# Patient Record
Sex: Female | Born: 1975 | Race: White | Hispanic: No | Marital: Married | Smoking: Never smoker
Health system: Southern US, Community
[De-identification: ages and names within clinical notes are randomized; demographics above are authoritative.]

## PROBLEM LIST (undated history)

## (undated) DIAGNOSIS — M549 Dorsalgia, unspecified: Secondary | ICD-10-CM

## (undated) DIAGNOSIS — M2550 Pain in unspecified joint: Secondary | ICD-10-CM

## (undated) DIAGNOSIS — R0602 Shortness of breath: Secondary | ICD-10-CM

## (undated) DIAGNOSIS — E039 Hypothyroidism, unspecified: Secondary | ICD-10-CM

## (undated) DIAGNOSIS — F32A Depression, unspecified: Secondary | ICD-10-CM

## (undated) DIAGNOSIS — E669 Obesity, unspecified: Secondary | ICD-10-CM

## (undated) DIAGNOSIS — R5383 Other fatigue: Secondary | ICD-10-CM

## (undated) DIAGNOSIS — E559 Vitamin D deficiency, unspecified: Secondary | ICD-10-CM

## (undated) DIAGNOSIS — F419 Anxiety disorder, unspecified: Secondary | ICD-10-CM

## (undated) HISTORY — DX: Dorsalgia, unspecified: M54.9

## (undated) HISTORY — DX: Pain in unspecified joint: M25.50

## (undated) HISTORY — DX: Shortness of breath: R06.02

## (undated) HISTORY — DX: Depression, unspecified: F32.A

## (undated) HISTORY — DX: Other fatigue: R53.83

## (undated) HISTORY — DX: Obesity, unspecified: E66.9

## (undated) HISTORY — DX: Vitamin D deficiency, unspecified: E55.9

## (undated) HISTORY — DX: Hypothyroidism, unspecified: E03.9

## (undated) HISTORY — DX: Anxiety disorder, unspecified: F41.9

---

## 1999-01-12 ENCOUNTER — Other Ambulatory Visit: Admission: RE | Admit: 1999-01-12 | Discharge: 1999-01-12 | Payer: Self-pay | Admitting: *Deleted

## 1999-06-18 ENCOUNTER — Other Ambulatory Visit: Admission: RE | Admit: 1999-06-18 | Discharge: 1999-06-18 | Payer: Self-pay | Admitting: Obstetrics and Gynecology

## 2000-01-24 ENCOUNTER — Other Ambulatory Visit: Admission: RE | Admit: 2000-01-24 | Discharge: 2000-01-24 | Payer: Self-pay | Admitting: Obstetrics and Gynecology

## 2001-02-06 ENCOUNTER — Other Ambulatory Visit: Admission: RE | Admit: 2001-02-06 | Discharge: 2001-02-06 | Payer: Self-pay | Admitting: Obstetrics and Gynecology

## 2002-03-03 ENCOUNTER — Other Ambulatory Visit: Admission: RE | Admit: 2002-03-03 | Discharge: 2002-03-03 | Payer: Self-pay | Admitting: Obstetrics and Gynecology

## 2003-03-16 ENCOUNTER — Other Ambulatory Visit: Admission: RE | Admit: 2003-03-16 | Discharge: 2003-03-16 | Payer: Self-pay | Admitting: Obstetrics and Gynecology

## 2004-03-20 ENCOUNTER — Other Ambulatory Visit: Admission: RE | Admit: 2004-03-20 | Discharge: 2004-03-20 | Payer: Self-pay | Admitting: Obstetrics and Gynecology

## 2008-03-22 ENCOUNTER — Other Ambulatory Visit: Admission: RE | Admit: 2008-03-22 | Discharge: 2008-03-22 | Payer: Self-pay | Admitting: Obstetrics and Gynecology

## 2009-08-18 ENCOUNTER — Ambulatory Visit (HOSPITAL_COMMUNITY): Admission: RE | Admit: 2009-08-18 | Discharge: 2009-08-18 | Payer: Self-pay | Admitting: Specialist

## 2009-09-07 ENCOUNTER — Ambulatory Visit (HOSPITAL_COMMUNITY): Admission: RE | Admit: 2009-09-07 | Discharge: 2009-09-07 | Payer: Self-pay | Admitting: Specialist

## 2010-04-24 ENCOUNTER — Inpatient Hospital Stay (HOSPITAL_COMMUNITY): Admission: AD | Admit: 2010-04-24 | Discharge: 2010-04-26 | Payer: Self-pay | Admitting: Obstetrics

## 2010-08-09 ENCOUNTER — Inpatient Hospital Stay (HOSPITAL_COMMUNITY): Admission: AD | Admit: 2010-08-09 | Payer: Self-pay | Admitting: Obstetrics and Gynecology

## 2010-09-30 ENCOUNTER — Encounter: Payer: Self-pay | Admitting: Specialist

## 2010-11-23 LAB — CBC
HCT: 38.7 % (ref 36.0–46.0)
Hemoglobin: 13.3 g/dL (ref 12.0–15.0)
MCV: 97.8 fL (ref 78.0–100.0)
RDW: 13.2 % (ref 11.5–15.5)
WBC: 25.6 10*3/uL — ABNORMAL HIGH (ref 4.0–10.5)

## 2013-07-13 LAB — OB RESULTS CONSOLE ANTIBODY SCREEN: Antibody Screen: NEGATIVE

## 2013-07-13 LAB — OB RESULTS CONSOLE RUBELLA ANTIBODY, IGM: Rubella: IMMUNE

## 2013-07-13 LAB — OB RESULTS CONSOLE HEPATITIS B SURFACE ANTIGEN: Hepatitis B Surface Ag: NEGATIVE

## 2013-07-13 LAB — OB RESULTS CONSOLE ABO/RH: RH TYPE: POSITIVE

## 2013-07-13 LAB — OB RESULTS CONSOLE HIV ANTIBODY (ROUTINE TESTING): HIV: NONREACTIVE

## 2013-07-27 LAB — OB RESULTS CONSOLE GC/CHLAMYDIA
Chlamydia: NEGATIVE
Gonorrhea: NEGATIVE

## 2013-09-09 NOTE — L&D Delivery Note (Signed)
Delivery Note  First Stage: Labor onset: 0630 Augmentation : none Analgesia /Anesthesia intrapartum: none AROM at 1010  Second Stage: Complete dilation at 1010 Onset of pushing at 1010 FHR second stage 120 doppler  Delivery of a viable female at [id number] by CNM in ROA position loose nuchal cord Cord double clamped after cessation of pulsation, cut by FOB Cord blood sample collected   Third Stage: [facility] with 3 VC @ 1031 Placenta disposition: for patient to take home - encapsulation Uterine tone firm / bleeding small  1st dgree laceration identified  Anesthesia for repair: 1% xylocaine local Repair 4-0 subcuticular Est. Blood Loss (mL): 250  Complications: none  Mom to postpartum.  Baby to Couplet care / Skin to Skin.  Newborn: Birth Weight: 8-4 Apgar Scores: 8-9 Feeding planned: breast  Marlinda MikeBAILEY, Larraine Argo CNM, MSN, FACNM 02/21/2014, 10:49 AM

## 2013-11-30 LAB — OB RESULTS CONSOLE RPR: RPR: NONREACTIVE

## 2014-01-20 ENCOUNTER — Inpatient Hospital Stay (HOSPITAL_COMMUNITY)
Admission: EM | Admit: 2014-01-20 | Discharge: 2014-01-20 | Disposition: A | Discharge status: Home / Self Care | Payer: 59 | Attending: Obstetrics & Gynecology | Admitting: Obstetrics & Gynecology

## 2014-01-25 LAB — OB RESULTS CONSOLE GBS: STREP GROUP B AG: NEGATIVE

## 2014-02-21 ENCOUNTER — Inpatient Hospital Stay (HOSPITAL_COMMUNITY)
Admission: AD | Admit: 2014-02-21 | Discharge: 2014-02-22 | DRG: 775 | Disposition: A | Discharge status: Home / Self Care | Payer: 59 | Source: Ambulatory Visit | Attending: Obstetrics & Gynecology | Admitting: Obstetrics & Gynecology

## 2014-02-21 ENCOUNTER — Encounter (HOSPITAL_COMMUNITY): Payer: Self-pay | Admitting: *Deleted

## 2014-02-21 DIAGNOSIS — O09529 Supervision of elderly multigravida, unspecified trimester: Secondary | ICD-10-CM | POA: Diagnosis present

## 2014-02-21 DIAGNOSIS — O99284 Endocrine, nutritional and metabolic diseases complicating childbirth: Secondary | ICD-10-CM

## 2014-02-21 DIAGNOSIS — E039 Hypothyroidism, unspecified: Secondary | ICD-10-CM | POA: Diagnosis present

## 2014-02-21 DIAGNOSIS — E079 Disorder of thyroid, unspecified: Secondary | ICD-10-CM | POA: Diagnosis present

## 2014-02-21 LAB — CBC
HCT: 43.9 % (ref 36.0–46.0)
Hemoglobin: 15.7 g/dL — ABNORMAL HIGH (ref 12.0–15.0)
MCH: 32.9 pg (ref 26.0–34.0)
MCHC: 35.8 g/dL (ref 30.0–36.0)
MCV: 92 fL (ref 78.0–100.0)
Platelets: 264 10*3/uL (ref 150–400)
RBC: 4.77 MIL/uL (ref 3.87–5.11)
RDW: 13.3 % (ref 11.5–15.5)
WBC: 19 10*3/uL — ABNORMAL HIGH (ref 4.0–10.5)

## 2014-02-21 LAB — ABO/RH: ABO/RH(D): O POS

## 2014-02-21 LAB — RPR

## 2014-02-21 MED ORDER — ACETAMINOPHEN 325 MG PO TABS: 650.0000 mg | ORAL_TABLET | ORAL | Status: DC | PRN

## 2014-02-21 MED ORDER — IBUPROFEN 600 MG PO TABS: 600.0000 mg | ORAL_TABLET | Freq: Four times a day (QID) | ORAL | Status: DC | PRN

## 2014-02-21 MED ORDER — OXYTOCIN 10 UNIT/ML IJ SOLN
INTRAMUSCULAR | Status: AC
  Administered 2014-02-21: 10 [IU] via INTRAMUSCULAR
  Filled 2014-02-21: qty 1

## 2014-02-21 MED ORDER — DIBUCAINE 1 % RE OINT: 1.0000 "application " | TOPICAL_OINTMENT | RECTAL | Status: DC | PRN

## 2014-02-21 MED ORDER — OXYCODONE-ACETAMINOPHEN 5-325 MG PO TABS
1.0000 | ORAL_TABLET | ORAL | Status: DC | PRN
  Administered 2014-02-21 (×3): 1 via ORAL
  Filled 2014-02-21 (×3): qty 1

## 2014-02-21 MED ORDER — LANOLIN HYDROUS EX OINT: TOPICAL_OINTMENT | CUTANEOUS | Status: DC | PRN

## 2014-02-21 MED ORDER — WITCH HAZEL-GLYCERIN EX PADS: 1.0000 "application " | MEDICATED_PAD | CUTANEOUS | Status: DC | PRN

## 2014-02-21 MED ORDER — BENZOCAINE-MENTHOL 20-0.5 % EX AERO
1.0000 "application " | INHALATION_SPRAY | CUTANEOUS | Status: DC | PRN
  Administered 2014-02-21: 1 via TOPICAL
  Filled 2014-02-21: qty 56

## 2014-02-21 MED ORDER — IBUPROFEN 600 MG PO TABS
600.0000 mg | ORAL_TABLET | Freq: Four times a day (QID) | ORAL | Status: DC
  Administered 2014-02-21 – 2014-02-22 (×5): 600 mg via ORAL
  Filled 2014-02-21 (×5): qty 1

## 2014-02-21 MED ORDER — LIDOCAINE HCL (PF) 1 % IJ SOLN
INTRAMUSCULAR | Status: AC
  Administered 2014-02-21: 30 mL via VAGINAL
  Filled 2014-02-21: qty 30

## 2014-02-21 MED ORDER — CITRIC ACID-SODIUM CITRATE 334-500 MG/5ML PO SOLN: 30.0000 mL | ORAL | Status: DC | PRN

## 2014-02-21 NOTE — H&P (Signed)
  OB ADMISSION/ HISTORY & PHYSICAL:  Admission Date: 02/21/2014  9:08 AM  Admit Diagnosis: 40.1 weeks / active labor / hypothyroidism   Shannon Wagner is a 38 y.o. female presenting for onset of labor at 0630 am.  Prenatal History: G2P1   EDC : 02[facility]  Primary Ob Provider: Marlinda Mikeanya Kellon Chalk CNM Prenatal course complicated by hypothyroidism - stable on meds  Prenatal Labs: ABO, Rh:   O positive Antibody:  negative Rubella:   Immune RPR:   NR HBsAg:   Negative HIV:   NR GTT: NL GBS:   negative  Medical / Surgical History :  Past medical history: Hypothyroidism  Past surgical history: History reviewed. No pertinent past surgical history.  Family History: No family history on file.   Social History:  reports that she has never smoked. She does not have any smokeless tobacco history on file. She reports that she does not use illicit drugs. Her alcohol history is not on file.  Allergies: Review of patient's allergies indicates not on file.    Current Medications at time of admission:  Prenatal vitamin Armour  Review of Systems: Active FM onset of ctx @ 0630am currently every 5 minutes NO LOF  bloody show present  Physical Exam:  VS: Blood pressure 103/76, pulse 86, temperature 97.6 F (36.4 C), temperature source Oral, resp. rate 18, height 5\' 4"  (1.626 m), weight 106.595 kg (235 lb).  General: alert and oriented, appears uncomfortable with ctx Heart: RRR Lungs: Clear lung fields Abdomen: Gravid, soft and non-tender, non-distended / uterus: gravid and non-tender Extremities: !+ dependent edema  Genitalia / VE:  7cm / 90% / vtx -1 BBOW  FHR: baseline rate 145 / variability moderaet / accelerations +  / no decelerations TOCO: ctx every 2-4  Assessment: [redacted] weeks gestation active stage of labor FHR category 1   Plan:  Admit - expectant management  Expect rapid progression to SVB  Dr Seymour BarsLavoie notified of admission  / plan of care   Marlinda MikeBAILEY, Kadey Mihalic CNM, MSN, [facility]ACNM 02/21/2014, 10:01 AM

## 2014-02-21 NOTE — Lactation Note (Signed)
This note was copied from the chart of Boy Dorinda HillJennifer Easton. Lactation Consultation Note  Patient Name: Boy Dorinda HillJennifer Speranza ZOXWR'UToday's Date: 02/21/2014 Reason for consult: Initial assessment of this second-time mother and her newborn at 10 hours postpartum.  Mom breastfed her older chld for 9 months.  This newborn had initial LATCH score=10 and has breastfed 4 times total since birth but is sleepy at this most recent attempt.  Mom states that she knows how to hand express her colostrum to encourage baby to feed and NT is preparing to do first bath which may help stimulate baby, although LC reminded mom that some sleepiness in first 24 hours is normal.  LC encouraged frequent STS and cue feedings. LC encouraged review of Baby and Me pp 9, 14 and 20-25 for STS and BF information. LC provided [employer] Resource brochure and reviewed [facility] services and list of community and web site resources.     Maternal Data Formula Feeding for Exclusion: No Infant to breast within first hour of birth: Yes (initial LATCH score=10 and baby nursed 30 minutes) Has patient been taught Hand Expression?: Yes (mom states she knows how to hand express) Does the patient have breastfeeding experience prior to this delivery?: Yes  Feeding Feeding Type: Breast Fed Length of feed: 0 min (sleepy)  LATCH Score/Interventions           initial LATCH score=10 and later assessment by RN, LATCH score=8           Lactation Tools Discussed/Used   STS, cue feedings, hand expression  Consult Status Consult Status: Follow-up Date: 02/22/14 Follow-up type: In-patient    Warrick ParisianBryant, Arlyn Bumpus [facility] 02/21/2014, 9:09 PM

## 2014-02-22 MED ORDER — IBUPROFEN 600 MG PO TABS: 600.0000 mg | ORAL_TABLET | Freq: Four times a day (QID) | ORAL | Status: AC

## 2014-02-22 NOTE — Progress Notes (Signed)
Patient ID: Shannon Wagner, female   DOB: 1976/04/14, 38 y.o.   MRN: [id number] PPD # 1 SVD  S:  Reports feeling well and ready for early discharge             Tolerating po/ No nausea or vomiting             Bleeding is spotting             Pain controlled with ibuprofen (OTC)             Up ad lib / ambulatory / voiding without difficulties    Newborn  Information for the patient's newborn:  Shannon Wagner, Boy Shannon Wagner [[id number]]  female  breast feeding  / Circumcision planning   O:  A & O x 3, in no apparent distress              VS:  Filed Vitals:   02/21/14 1650 02/21/14 1655 02/21/14 2242 02/22/14 0539  BP: 108/72 108/72 119/77 115/68  Pulse: 84 84 74 82  Temp: 98.3 F (36.8 C) 98.3 F (36.8 C) 98.9 F (37.2 C) 98.3 F (36.8 C)  TempSrc: Oral Oral Oral Oral  Resp: 18 18 18 18   Height:      Weight:        LABS:  Recent Labs  02/21/14 1000  WBC 19.0*  HGB 15.7*  HCT 43.9  PLT 264    Blood type: --/--/O POS (06/15 1000)  Rubella: Immune (11/04 0000)   I&O: I/O last 3 completed shifts: In: -  Out: 250 [Blood:250]             Lungs: Clear and unlabored  Heart: regular rate and rhythm / no murmurs  Abdomen: soft, non-tender, non-distended              Fundus: firm, non-tender, U-1  Perineum: 1st degree repair healing well, no edema  Lochia: minimal  Extremities: no edema, no calf pain or tenderness, no Homans    A/P: PPD # 1  38 y.o., G2P2   Active Problems:   Postpartum care following vaginal delivery (6/15)   Doing well - stable status  Routine post partum orders  Desires early discharge today    DAWSON, ROLITTA, M, MSN, CNM 02/22/2014, 8:05 AM

## 2014-02-22 NOTE — Discharge Summary (Signed)
**Note De-Identified Denym Christenberry Obfuscation** OBSTETRICAL DISCHARGE SUMMARY   Patient ID: Shannon Wagner MRN: [id number] DOB/AGE: [birth year] 38 y.o.  Admit date: 02/21/2014 Admission Diagnoses: 40.1 wks active labor / hypothyroidism    Discharge date: 02/22/2014 Discharge Diagnoses: Spontaneous Vaginal Delivery  Reason for Admission: onset of labor  Prenatal history: G2P2   EDC : Not found.  [facility]  Primary provider : Marlinda Mikeanya Bailey, CNM Prenatal course complicated by hypothyroidism  Prenatal Labs: ABO, Rh: --/--/O POS (06/15 1000)  Antibody: Negative (11/04 0000) Rubella:   RPR: NON REAC (06/15 1000)  HBsAg: Negative (11/04 0000)  HIV: Non-reactive (11/04 0000)  GBS: Negative (05/19 0000)   Labor Summary: SDE is required. Please contact your administrator to configure this SmartLink.    Anesthesia: 1% xylocaine for perineal repair Procedures: 1st degree perineal repair Complications: none  Newborn Data:  Gender: female on 02/21/2014 Feeding method : breast Circumcision: yes Weight: 8 lbs 3.9 oz Apgar: 8/9  Discharge Information: Condition: stable Activity: pelvic rest Diet: routine Medications: PNV, Ibuprofen and Synthroid   Instructions: [facility] / instructions reviewed Discharge to: home Follow up : [facility] at 6 weeks postpartum  Signed: Kenard GowerAWSON, ROLITTA, M CNM, MSN 02/22/2014, 8:54 AM

## 2014-02-22 NOTE — Discharge Instructions (Signed)
Breast Pumping Tips °Pumping your breast milk is a good way to stimulate milk production and have a steady supply of breast milk for your infant. Pumping is most helpful during your infant's growth spurts, when involving dad or a family member, or when you are away. There are several types of pumps available. They can be purchased at a baby or maternity store. You can begin pumping soon after delivery, but some experts believe that you should wait about four weeks to give your infant a bottle. °In general, the more you breastfeed or pump, the more milk you will have for your infant. It is also important to take good care of yourself. This will reduce stress and help your body to create a healthy supply of milk. Your caregiver or lactation consultant can give you the information and support you need in your efforts to breastfeed your infant. °PUMPING BREAST MILK  °Follow the tips below for successful breast pumping. °Take care of yourself. °· Drink enough water or fluids to keep urine clear or pale yellow. You may notice a thirsty feeling while breastfeeding. This is because your body needs more water to make breast milk. Keep a large water bottle handy. Make healthy drink choices such as unsweetened fruit juice, milk and water. Limit soda, coffee, and alcohol (wait 2 hours to feed or pump if you have an alcoholic drink.) °· Eat a healthy, well-balanced diet rich in fruits, vegetables, and whole grains. °· Exercise as recommended by your caregiver. °· Get plenty of sleep. Sleep when your infant sleeps. Ask friends and family for help if you need time to nap or rest. °· Do not smoke. Smoking can lower your milk supply and harm your infant. If you need help quitting, ask your caregiver for a program recommendation. °· Ask your caregiver about birth control options. Birth control pills may lower your milk supply. You may be advised to use condoms or other forms of birth control. °Relax and pump °Stimulating your  let-down reflex is the [place] to successful and effective pumping. This makes the milk in all parts of the breast flow more freely.  °· It is easier to pump breast milk (and breastfeed) while you are relaxed. Find techniques that work for you. Quiet private spaces, breast massage, soothing heat placed on the breast, music, and pictures or a tape recording of your infant may help you to relax and "let down" your milk. If you have difficulty with your let down, try smelling one of your infant's blankets or an item of clothing he or she has worn while you are pumping. °· When pumping, place the special suction cup (flange) directly over the nipple. It may be uncomfortable and cause nipple damage if it is not placed properly or is the wrong size. Applying a small amount of purified or modified lanolin to your nipple and the areola may help increase your comfort level. Also, you can change the speed and suction of many electric pumps to your comfort level. Your caregiver or lactation consultant can help you with this. °· If pumping continues to be painful, or you feel you are not getting very much milk when you pump, you may need a different type of pump. A lactation consultant can help you determine if this is the case. °· If you are with your infant, feed him or her on demand and try pumping after each feeding. This will boost your production, even if milk does not come out. You may not be able   to pump much milk at first, but keep up the routine, and this will change. °· If you are working or away from your infant for several hours, try pumping for about 15 minutes every 2 to 3 hours. Pump both breasts at the same time if you can. °· If your infant has a formula feeding, make sure you pump your milk around the same time to maintain your supply. °· Begin pumping breast milk a few weeks before you return to work. This will help you develop techniques that work for you and will be able to store extra milk. °· Find a source  of breastfeeding information that works well for you. °TIPS FOR STORING BREAST MILK °· Store breast milk in a sealable sterile bag, jar, or container provided with your pumping supplies. °· Store milk in small amounts close to what your infant is drinking at each feeding. °· Cool pumped milk in a refrigerator or cooler. Pumped milk can last at the back of the refrigerator for 3 to 8 days. °· Place cooled milk at the back of the freezer for up to 3 months. °· Thaw the milk in its container or bag in warm water up to 24 hours in advance. Do not use a microwave to thaw or heat milk. Do not refreeze the milk after it has been thawed. °· Breast milk is safe to drink when left at room temperature (mid 70s or colder) for 4 to 8 hours. After that, throw it away. °· Milk fat can separate and look funny. The color can vary slightly from day to day. This is normal. Always shake the milk before using it to mix the fat with the more watery portion. °SEEK MEDICAL CARE IF:  °· You are having trouble pumping or feeding your infant. °· You are concerned that you are not making enough milk. °· You have nipple pain, soreness, or redness. °· You have other questions or concerns related to you or your infant. °Document Released: 02/13/2010 Document Revised: 11/18/2011 Document Reviewed: 02/13/2010 °[employer]® Patient Information ©2014 [employer]. °Postpartum Depression and Baby Blues °The postpartum period begins right after the birth of a baby. During this time, there is often a great amount of joy and excitement. It is also a time of considerable changes in the life of the parent(s). Regardless of how many times a mother gives birth, each child brings new challenges and dynamics to the family. It is not unusual to have feelings of excitement accompanied by confusing shifts in moods, emotions, and thoughts. All mothers are at risk of developing postpartum depression or the "baby blues." These mood changes can occur right after giving  birth, or they may occur many months after giving birth. The baby blues or postpartum depression can be mild or severe. Additionally, postpartum depression can resolve rather quickly, or it can be a long-term condition. °CAUSES °Elevated hormones and their rapid decline are thought to be a main cause of postpartum depression and the baby blues. There are a number of hormones that radically change during and after pregnancy. Estrogen and progesterone usually decrease immediately after delivering your baby. The level of thyroid hormone and various cortisol steroids also rapidly drop. Other factors that play a major role in these changes include major life events and genetics.  °RISK FACTORS °If you have any of the following risks for the baby blues or postpartum depression, know what symptoms to watch out for during the postpartum period. Risk factors that may increase the likelihood of   getting the baby blues or postpartum depression include: °· Having a personal or family history of depression. °· Having depression while being pregnant. °· Having premenstrual or oral contraceptive-associated mood issues. °· Having exceptional life stress. °· Having marital conflict. °· Lacking a social support network. °· Having a baby with special needs. °· Having health problems such as diabetes. °SYMPTOMS °Baby blues symptoms include: °· Brief fluctuations in mood, such as going from extreme happiness to sadness. °· Decreased concentration. °· Difficulty sleeping. °· Crying spells, tearfulness. °· Irritability. °· Anxiety. °Postpartum depression symptoms typically begin within the first month after giving birth. These symptoms include: °· Difficulty sleeping or excessive sleepiness. °· Marked weight loss. °· Agitation. °· Feelings of worthlessness. °· Lack of interest in activity or food. °Postpartum psychosis is a very concerning condition and can be dangerous. Fortunately, it is rare. Displaying any of the following symptoms is  cause for immediate medical attention. Postpartum psychosis symptoms include: °· Hallucinations and delusions. °· Bizarre or disorganized behavior. °· Confusion or disorientation. °DIAGNOSIS  °A diagnosis is made by an evaluation of your symptoms. There are no medical or lab tests that lead to a diagnosis, but there are various questionnaires that a caregiver may use to identify those with the baby blues, postpartum depression, or psychosis. Often times, a screening tool called the Edinburgh Postnatal Depression Scale is used to diagnose depression in the postpartum period.  °TREATMENT °The baby blues usually goes away on its own in 1 to 2 weeks. Social support is often all that is needed. You should be encouraged to get adequate sleep and rest. Occasionally, you may be given medicines to help you sleep.  °Postpartum depression requires treatment as it can last several months or longer if it is not treated. Treatment may include individual or group therapy, medicine, or both to address any social, physiological, and psychological factors that may play a role in the depression. Regular exercise, a healthy diet, rest, and social support may also be strongly recommended.  °Postpartum psychosis is more serious and needs treatment right away. Hospitalization is often needed. °HOME CARE INSTRUCTIONS °· Get as much rest as you can. Nap when the baby sleeps. °· Exercise regularly. Some women find yoga and walking to be beneficial. °· Eat a balanced and nourishing diet. °· Do little things that you enjoy. Have a cup of tea, take a bubble bath, read your favorite magazine, or listen to your favorite music. °· Avoid alcohol. °· Ask for help with household chores, cooking, grocery shopping, or running errands as needed. Do not try to do everything. °· Talk to people close to you about how you are feeling. Get support from your partner, family members, friends, or other new moms. °· Try to stay positive in how you think. Think  about the things you are grateful for. °· Do not spend a lot of time alone. °· Only take medicine as directed by your caregiver. °· Keep all your postpartum appointments. °· Let your caregiver know if you have any concerns. °SEEK MEDICAL CARE IF: °You are having a reaction or problems with your medicine. °SEEK IMMEDIATE MEDICAL CARE IF: °· You have suicidal feelings. °· You feel you may harm the baby or someone else. °Document Released: 05/30/2004 Document Revised: 11/18/2011 Document Reviewed: 06/07/2013 °[employer]® Patient Information ©2014 [employer]. °Mastitis  °Mastitis is inflammation of the breast tissue. It occurs most often in women who are breastfeeding, but it can also affect other women, and even sometimes men. °CAUSES  °Mastitis   is usually caused by a bacterial infection. Bacteria enter the breast tissue through cuts or openings in the skin. Typically, this occurs with breastfeeding because of cracked or irritated skin. Sometimes, it can occur even when there is no opening in the skin. It can be associated with plugged milk (lactiferous) ducts. Nipple piercing can also lead to mastitis. Also, some forms of breast cancer can cause mastitis. °SIGNS AND SYMPTOMS  °· Swelling, redness, tenderness, and pain in an area of the breast. °· Swelling of the glands under the arm on the same side. °· Fever. °If an infection is allowed to progress, a collection of pus (abscess) may develop. °DIAGNOSIS  °Your health care provider can usually diagnose mastitis based on your symptoms and a physical exam. Tests may be done to help confirm the diagnosis. These may include:  °· Removal of pus from the breast by applying pressure to the area. This pus can be examined in the lab to determine which bacteria are present. If an abscess has developed, the fluid in the abscess can be removed with a needle. This can also be used to confirm the diagnosis and determine the bacteria present. In most cases, pus will not be  present. °· Blood tests to determine if your body is fighting a bacterial infection. °· Mammogram or ultrasound tests to rule out other problems or diseases. °TREATMENT  °Antibiotic medicine is used to treat a bacterial infection. Your health care provider will determine which bacteria are most likely causing the infection and will select an appropriate antibiotic. This is sometimes changed based on the results of tests performed to identify the bacteria, or if there is no response to the antibiotic selected. Antibiotics are usually given by mouth. You may also be given medicine for pain. °Mastitis that occurs with breastfeeding will sometimes go away on its own, so your health care provider may choose to wait 24 hours after first seeing you to decide whether a prescription medicine is needed. °HOME CARE INSTRUCTIONS  °· Only take over-the-counter or prescription medicines for pain, fever, or discomfort as directed by your health care provider. °· If your health care provider prescribed an antibiotic, take the medicine as directed. Make sure you finish it even if you start to feel better. °· Do not wear a tight or underwire bra. Wear a soft, supportive bra. °· Increase your fluid intake, especially if you have a fever. °· Women who are breastfeeding should follow these instructions: °· Continue to empty the breast. Your health care provider can tell you whether this milk is safe for your infant or needs to be thrown out. You may be told to stop nursing until your health care provider thinks it is safe for your baby. Use a breast pump if you are advised to stop nursing. °· Keep your nipples clean and dry. °· Empty the first breast completely before going to the other breast. If your baby is not emptying your breasts completely for some reason, use a breast pump to empty your breasts. °· If you go back to work, pump your breasts while at work to stay in time with your nursing schedule. °· Avoid allowing your breasts  to become overly filled with milk (engorged). °SEEK MEDICAL CARE IF:  °· You have pus-like discharge from the breast. °· Your symptoms do not improve with the treatment prescribed by your health care provider within 2 days. °SEEK IMMEDIATE MEDICAL CARE IF:  °· Your pain and swelling are getting worse. °· You have   pain that is not controlled with medicine. °· You have a red line extending from the breast toward your armpit. °· You have a fever or persistent symptoms for more than 2 3 days. °· You have a fever and your symptoms suddenly get worse. °Document Released: 08/26/2005 Document Revised: 06/16/2013 Document Reviewed: 03/26/2013 °[employer]® Patient Information ©2014 [employer]. °Breastfeeding °Deciding to breastfeed is one of the best choices you can make for you and your baby. A change in hormones during pregnancy causes your breast tissue to grow and increases the number and size of your milk ducts. These hormones also allow proteins, sugars, and fats from your blood supply to make breast milk in your milk-producing glands. Hormones prevent breast milk from being released before your baby is born as well as prompt milk flow after birth. Once breastfeeding has begun, thoughts of your baby, as well as his or her sucking or crying, can stimulate the release of milk from your milk-producing glands.  °BENEFITS OF BREASTFEEDING °For Your Baby °· Your first milk (colostrum) helps your baby's digestive system function better.   °· There are antibodies in your milk that help your baby fight off infections.   °· Your baby has a lower incidence of asthma, allergies, and sudden infant death syndrome.   °· The nutrients in breast milk are better for your baby than infant formulas and are designed uniquely for your baby's needs.   °· Breast milk improves your baby's brain development.   °· Your baby is less likely to develop other conditions, such as childhood obesity, asthma, or type 2 diabetes mellitus.   °For You   °· Breastfeeding helps to create a very special bond between you and your baby.   °· Breastfeeding is convenient. Breast milk is always available at the correct temperature and costs nothing.   °· Breastfeeding helps to burn calories and helps you lose the weight gained during pregnancy.   °· Breastfeeding makes your uterus contract to its prepregnancy size faster and slows bleeding (lochia) after you give birth.   °· Breastfeeding helps to lower your risk of developing type 2 diabetes mellitus, osteoporosis, and breast or ovarian cancer later in life. °SIGNS THAT YOUR BABY IS HUNGRY °Early Signs of Hunger  °· Increased alertness or activity. °· Stretching. °· Movement of the head from side to side. °· Movement of the head and opening of the mouth when the corner of the mouth or cheek is stroked (rooting). °· Increased sucking sounds, smacking lips, cooing, sighing, or squeaking. °· Hand-to-mouth movements. °· Increased sucking of fingers or hands. °Late Signs of Hunger °· Fussing. °· Intermittent crying. °Extreme Signs of Hunger °Signs of extreme hunger will require calming and consoling before your baby will be able to breastfeed successfully. Do not wait for the following signs of extreme hunger to occur before you initiate breastfeeding:   °· Restlessness. °· A loud, strong cry. °·  Screaming. °BREASTFEEDING BASICS °Breastfeeding Initiation °· Find a comfortable place to sit or lie down, with your neck and back well supported. °· Place a pillow or rolled up blanket under your baby to bring him or her to the level of your breast (if you are seated). Nursing pillows are specially designed to help support your arms and your baby while you breastfeed. °· Make sure that your baby's abdomen is facing your abdomen.   °· Gently massage your breast. With your fingertips, massage from your chest wall toward your nipple in a circular motion. This encourages milk flow. You may need to continue this action during the  feeding   if your milk flows slowly. °· Support your breast with 4 fingers underneath and your thumb above your nipple. Make sure your fingers are well away from your nipple and your baby's mouth.   °· Stroke your baby's lips gently with your finger or nipple.   °· When your baby's mouth is open wide enough, quickly bring your baby to your breast, placing your entire nipple and as much of the colored area around your nipple (areola) as possible into your baby's mouth.   °· More areola should be visible above your baby's upper lip than below the lower lip.   °· Your baby's tongue should be between his or her lower gum and your breast.   °· Ensure that your baby's mouth is correctly positioned around your nipple (latched). Your baby's lips should create a seal on your breast and be turned out (everted). °· It is common for your baby to suck about 2 3 minutes in order to start the flow of breast milk. °Latching °Teaching your baby how to latch on to your breast properly is very important. An improper latch can cause nipple pain and decreased milk supply for you and poor weight gain in your baby. Also, if your baby is not latched onto your nipple properly, he or she may swallow some air during feeding. This can make your baby fussy. Burping your baby when you switch breasts during the feeding can help to get rid of the air. However, teaching your baby to latch on properly is still the best way to prevent fussiness from swallowing air while breastfeeding. °Signs that your baby has successfully latched on to your nipple:    °· Silent tugging or silent sucking, without causing you pain.   °· Swallowing heard between every 3 4 sucks.   °·  Muscle movement above and in front of his or her ears while sucking.   °Signs that your baby has not successfully latched on to nipple:  °· Sucking sounds or smacking sounds from your baby while breastfeeding. °· Nipple pain. °If you think your baby has not latched on correctly, slip your  finger into the corner of your baby's mouth to break the suction and place it between your baby's gums. Attempt breastfeeding initiation again. °Signs of Successful Breastfeeding °Signs from your baby:   °· A gradual decrease in the number of sucks or complete cessation of sucking.   °· Falling asleep.   °· Relaxation of his or her body.   °· Retention of a small amount of milk in his or her mouth.   °· Letting go of your breast by himself or herself. °Signs from you: °· Breasts that have increased in firmness, weight, and size 1 3 hours after feeding.   °· Breasts that are softer immediately after breastfeeding. °· Increased milk volume, as well as a change in milk consistency and color by the 5th day of breastfeeding.   °· Nipples that are not sore, cracked, or bleeding. °Signs That Your Baby is Getting Enough Milk °· Wetting at least 3 diapers in a 24-hour period. The urine should be clear and pale yellow by age 5 days. °· At least 3 stools in a 24-hour period by age 5 days. The stool should be soft and yellow. °· At least 3 stools in a 24-hour period by age 7 days. The stool should be seedy and yellow. °· No loss of weight greater than 10% of birth weight during the first 3 days of age. °· Average weight gain of 4 7 ounces (120 210 mL) per week after age 4 days. °·   Consistent daily weight gain by age 5 days, without weight loss after the age of 2 weeks. °After a feeding, your baby may spit up a small amount. This is common. °BREASTFEEDING FREQUENCY AND DURATION °Frequent feeding will help you make more milk and can prevent sore nipples and breast engorgement. Breastfeed when you feel the need to reduce the fullness of your breasts or when your baby shows signs of hunger. This is called "breastfeeding on demand." Avoid introducing a pacifier to your baby while you are working to establish breastfeeding (the first 4 6 weeks after your baby is born). After this time you may choose to use a pacifier. Research has  shown that pacifier use during the first year of a baby's life decreases the risk of sudden infant death syndrome (SIDS). °Allow your baby to feed on each breast as long as he or she wants. Breastfeed until your baby is finished feeding. When your baby unlatches or falls asleep while feeding from the first breast, offer the second breast. Because newborns are often sleepy in the first few weeks of life, you may need to awaken your baby to get him or her to feed. °Breastfeeding times will vary from baby to baby. However, the following rules can serve as a guide to help you ensure that your baby is properly fed: °· Newborns (babies 4 weeks of age or younger) may breastfeed every 1 3 hours. °· Newborns should not go longer than 3 hours during the day or 5 hours during the night without breastfeeding. °· You should breastfeed your baby a minimum of 8 times in a 24-hour period until you begin to introduce solid foods to your baby at around 6 months of age. °BREAST MILK PUMPING °Pumping and storing breast milk allows you to ensure that your baby is exclusively fed your breast milk, even at times when you are unable to breastfeed. This is especially important if you are going back to work while you are still breastfeeding or when you are not able to be present during feedings. Your lactation consultant can give you guidelines on how long it is safe to store breast milk.  °A breast pump is a machine that allows you to pump milk from your breast into a sterile bottle. The pumped breast milk can then be stored in a refrigerator or freezer. Some breast pumps are operated by hand, while others use electricity. Ask your lactation consultant which type will work best for you. Breast pumps can be purchased, but some hospitals and breastfeeding support groups lease breast pumps on a monthly basis. A lactation consultant can teach you how to hand express breast milk, if you prefer not to use a pump.  °CARING FOR YOUR BREASTS WHILE  YOU BREASTFEED °Nipples can become dry, cracked, and sore while breastfeeding. The following recommendations can help keep your breasts moisturized and healthy: °· Avoid using soap on your nipples.   °· Wear a supportive bra. Although not required, special nursing bras and tank tops are designed to allow access to your breasts for breastfeeding without taking off your entire bra or top. Avoid wearing underwire style bras or extremely tight bras. °· Air dry your nipples for 3 4 minutes after each feeding.   °· Use only cotton bra pads to absorb leaked breast milk. Leaking of breast milk between feedings is normal.   °· Use lanolin on your nipples after breastfeeding. Lanolin helps to maintain your skin's normal moisture barrier. If you use pure lanolin you do not need to wash   it off before feeding your baby again. Pure lanolin is not toxic to your baby. You may also hand express a few drops of breast milk and gently massage that milk into your nipples and allow the milk to air dry. °In the first few weeks after giving birth, some women experience extremely full breasts (engorgement). Engorgement can make your breasts feel heavy, warm, and tender to the touch. Engorgement peaks within 3 5 days after you give birth. The following recommendations can help ease engorgement: °· Completely empty your breasts while breastfeeding or pumping. You may want to start by applying warm, moist heat (in the shower or with warm water-soaked hand towels) just before feeding or pumping. This increases circulation and helps the milk flow. If your baby does not completely empty your breasts while breastfeeding, pump any extra milk after he or she is finished. °· Wear a snug bra (nursing or regular) or tank top for 1 2 days to signal your body to slightly decrease milk production. °· Apply ice packs to your breasts, unless this is too uncomfortable for you. °· Make sure that your baby is latched on and positioned properly while  breastfeeding. °If engorgement persists after 48 hours of following these recommendations, contact your health care provider or a lactation consultant. °OVERALL HEALTH CARE RECOMMENDATIONS WHILE BREASTFEEDING °· Eat healthy foods. Alternate between meals and snacks, eating 3 of each per day. Because what you eat affects your breast milk, some of the foods may make your baby more irritable than usual. Avoid eating these foods if you are sure that they are negatively affecting your baby. °· Drink milk, fruit juice, and water to satisfy your thirst (about 10 glasses a day).   °· Rest often, relax, and continue to take your prenatal vitamins to prevent fatigue, stress, and anemia. °· Continue breast self-awareness checks. °· Avoid chewing and smoking tobacco. °· Avoid alcohol and drug use. °Some medicines that may be harmful to your baby can pass through breast milk. It is important to ask your health care provider before taking any medicine, including all over-the-counter and prescription medicine as well as vitamin and herbal supplements. °It is possible to become pregnant while breastfeeding. If birth control is desired, ask your health care provider about options that will be safe for your baby. °SEEK MEDICAL CARE IF:  °· You feel like you want to stop breastfeeding or have become frustrated with breastfeeding. °· You have painful breasts or nipples. °· Your nipples are cracked or bleeding. °· Your breasts are red, tender, or warm. °· You have a swollen area on either breast. °· You have a fever or chills. °· You have nausea or vomiting. °· You have drainage other than breast milk from your nipples. °· Your breasts do not become full before feedings by the 5th day after you give birth. °· You feel sad and depressed. °· Your baby is too sleepy to eat well. °· Your baby is having trouble sleeping.   °· Your baby is wetting less than 3 diapers in a 24-hour period. °· Your baby has less than 3 stools in a 24-hour  period. °· Your baby's skin or the white part of his or her eyes becomes yellow.   °· Your baby is not gaining weight by 5 days of age. °SEEK IMMEDIATE MEDICAL CARE IF:  °· Your baby is overly tired (lethargic) and does not want to wake up and feed. °· Your baby develops an unexplained fever. °Document Released: 08/26/2005 Document Revised: 04/28/2013 Document Reviewed:   02/17/2013 °ExitCare® Patient Information ©2014 [employer]. ° °

## 2014-02-25 NOTE — Progress Notes (Signed)
Post discharge chart review completed.  

## 2014-03-01 ENCOUNTER — Encounter (HOSPITAL_COMMUNITY): Payer: Self-pay | Admitting: *Deleted

## 2014-07-11 ENCOUNTER — Encounter (HOSPITAL_COMMUNITY): Payer: Self-pay | Admitting: *Deleted

## 2015-06-30 ENCOUNTER — Ambulatory Visit (INDEPENDENT_AMBULATORY_CARE_PROVIDER_SITE_OTHER): Payer: 59 | Admitting: Family Medicine

## 2015-06-30 VITALS — BP 106/64 | HR 68 | Temp 98.0°F | Resp 16 | Ht 65.0 in | Wt 209.0 lb

## 2015-06-30 DIAGNOSIS — J020 Streptococcal pharyngitis: Secondary | ICD-10-CM

## 2015-06-30 LAB — POCT RAPID STREP A (OFFICE): Rapid Strep A Screen: NEGATIVE

## 2015-06-30 MED ORDER — AMOXICILLIN 500 MG PO CAPS: 500.0000 mg | ORAL_CAPSULE | Freq: Two times a day (BID) | ORAL | Status: DC

## 2015-06-30 NOTE — Patient Instructions (Signed)

## 2015-06-30 NOTE — Progress Notes (Addendum)
Subjective:  This chart was scribed for Norberto SorensonEva Shaw, MD by Andrew Auaven Small, ED Scribe. This patient was seen in room 13 and the patient's care was started at 2:45 PM.   Patient ID: Shannon Wagner, female    DOB: 10-21-1975, 39 y.o.   MRN: [id number]  HPI . Chief Complaint  Patient presents with  . Sore Throat    x 3 days  . Nasal Congestion  . Fever    Unspecified   HPI Comments: Shannon Wagner is a 39 y.o. female who presents to the [facility] complaining of a sore throat for 3 days. Pt states symptoms started with body aches followed by chills and sweats later that night. She has been taking advil for symptoms without relief. She's had exposure to strep from her 715 y.o. son who has been treated.  She denies fever. She denies drug allergies or being on abx within the last month.   Past Medical History  Diagnosis Date  . Thyroid disease    No Known Allergies Prior to Admission medications   Medication Sig Start Date End Date Taking? Authorizing Provider  Cholecalciferol (VITAMIN D) 2000 UNITS tablet Take 2,000 Units by mouth daily.   Yes Historical Provider, MD  diphenhydrAMINE (BENADRYL) 25 MG tablet Take 25 mg by mouth every 6 (six) hours as needed for sleep.   Yes Historical Provider, MD  ibuprofen (ADVIL,MOTRIN) 600 MG tablet Take 1 tablet (600 mg total) by mouth every 6 (six) hours. 02/22/14  Yes Raelyn Moraolitta Dawson, CNM  levothyroxine (SYNTHROID, LEVOTHROID) 175 MCG tablet Take 175 mcg by mouth daily before breakfast.   Yes Historical Provider, MD  Multiple Vitamin (MULTI VITAMIN PO) Take by mouth.   Yes Historical Provider, MD  spironolactone (ALDACTONE) 50 MG tablet Take 50 mg by mouth daily.   Yes Historical Provider, MD  Prenatal Vit-Fe Fumarate-FA (PRENATAL MULTIVITAMIN) TABS tablet Take 1 tablet by mouth daily at 12 noon.    Historical Provider, MD  ranitidine (ZANTAC) 75 MG tablet Take 75 mg by mouth 2 (two) times daily.    Historical Provider, MD   Review of  Systems  Constitutional: Positive for chills and diaphoresis. Negative for fever.  HENT: Positive for sore throat.   Respiratory: Negative for cough.   Gastrointestinal: Negative for nausea and vomiting.   Objective:   Physical Exam  Constitutional: She is oriented to person, place, and time. She appears well-developed and well-nourished. No distress.  HENT:  Head: Normocephalic and atraumatic.  Right Ear: Hearing, tympanic membrane, external ear and ear canal normal.  Left Ear: Hearing, tympanic membrane, external ear and ear canal normal.  Mouth/Throat: Posterior oropharyngeal edema and posterior oropharyngeal erythema present.  Nares with erythema and dry purulent discharge,   Eyes: Conjunctivae and EOM are normal.  Neck: Neck supple. No thyromegaly present.  Cardiovascular: Normal rate, regular rhythm and normal heart sounds.  Exam reveals no gallop and no friction rub.   No murmur heard. Pulmonary/Chest: Effort normal and breath sounds normal. No respiratory distress. She has no wheezes. She has no rales. She exhibits no tenderness.  Musculoskeletal: Normal range of motion.  Lymphadenopathy:       Head (right side): Tonsillar adenopathy present.       Head (left side): Tonsillar adenopathy present.    She has cervical adenopathy ( anterior).  Neurological: She is alert and oriented to person, place, and time.  Skin: Skin is warm and dry.  Psychiatric: She has a normal mood  and affect. Her behavior is normal.  Nursing note and vitals reviewed.  Filed Vitals:   06/30/15 1403  BP: 106/64  Pulse: 68  Temp: 98 F (36.7 C)  TempSrc: Oral  Resp: 16  Height:  (1.651 m)  Weight: 209 lb (94.802 kg)  SpO2: 99%   Results for orders placed or performed in visit on 06/30/15  POCT rapid strep A  Result Value Ref Range   Rapid Strep A Screen Negative Negative   Assessment & Plan:   1. Acute streptococcal pharyngitis   Will trx despite neg result as sxs and clinical exam  VERY consistent w/ strep throat.  Orders Placed This Encounter  Procedures  . Culture, Group A Strep  . POCT rapid strep A    Meds ordered this encounter  Medications  . spironolactone (ALDACTONE) 50 MG tablet    Sig: Take 50 mg by mouth daily.  . Multiple Vitamin (MULTI VITAMIN PO)    Sig: Take by mouth.  Marland Kitchen amoxicillin (AMOXIL) 500 MG capsule    Sig: Take 1 capsule (500 mg total) by mouth 2 (two) times daily.    Dispense:  20 capsule    Refill:  0    By signing my name below, I, Raven Small, attest that this documentation has been prepared under the direction and in the presence of Norberto Sorenson, MD.  Electronically Signed: Andrew Au, ED Scribe. 06/30/2015. 3:15 PM.  I personally performed the services described in this documentation, which was scribed in my presence. The recorded information has been reviewed and considered, and addended by me as needed.  Norberto Sorenson, MD MPH

## 2015-07-02 LAB — CULTURE, GROUP A STREP: ORGANISM ID, BACTERIA: NORMAL

## 2015-09-28 MED FILL — LEVOTHYROXINE 137 MCG TAB: 137 | 30 days supply | Qty: 30 | Fill #0

## 2015-09-28 MED FILL — SPIRONOLACTONE 50 MG TABLET: 50 | 30 days supply | Qty: 30 | Fill #3

## 2015-10-21 ENCOUNTER — Ambulatory Visit (INDEPENDENT_AMBULATORY_CARE_PROVIDER_SITE_OTHER): Payer: 59 | Admitting: Family Medicine

## 2015-10-21 VITALS — BP 120/80 | HR 97 | Temp 97.4°F | Resp 18 | Ht 65.0 in | Wt 212.6 lb

## 2015-10-21 DIAGNOSIS — L500 Allergic urticaria: Secondary | ICD-10-CM

## 2015-10-21 MED ORDER — PREDNISONE 20 MG PO TABS: ORAL_TABLET | ORAL | Status: DC

## 2015-10-21 MED ORDER — EPINEPHRINE 0.3 MG/0.3ML IJ SOAJ: 0.3000 mg | Freq: Once | INTRAMUSCULAR | Status: DC

## 2015-10-21 NOTE — Patient Instructions (Signed)
We will try a course of prednisone to control your itching and hives.  Take 2 pills a day for 5 days, then 1 pill a day for 5 days Daily claritin or zyrtec and an OTC H2 blocker such as zantac or pepcid can also help- also ok to use benadryl as needed but be cautious of sedation Avoid NSAID use while you are on prednisone   Please give me a call if you are not improved in the next 1-2 days- Sooner if worse.      I did give you an epipen rx to have on hand- use this if you develop any signs of lip/ tongue/ throat swelling and difficulty breathing.  If you do use the pen also call [phone] for further care!

## 2015-10-21 NOTE — Progress Notes (Signed)
[facility] [address], [place] [address] [phone]  Date:  10/21/2015   Name:  Shannon Wagner   DOB:  06-13-1976   MRN:  [id number]  PCP:  No primary care provider on file.    Chief Complaint: Urticaria   History of Present Illness:  Shannon Wagner is a 40 y.o. very pleasant female patient who presents with the following:  Generally healthy young woman with a history of thyroid disease. Here today with concern of itching.  She developed some hives yesterday while at work, seemed to be mostly under her shirt.  'I felt like someone put itching powder in my bra."  She noticed hives on her trunk.  She took claritin and pepcid during the day and got better, but the sx did retun some yesterday everning.  She took some benadryl before bed.  Hives still there today so she came in to be seen She is not aware of any trigger- she is not aware of any new foods, products, medications, etc  She has never had this sort of problem in the past  She has had a bit of a cold so her throat is a bit scratchy, but she does not have any lip or tongue swelling, no wheezing.  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 Postpartum care following vaginal delivery (6/15) 02/21/2014    Past Medical History  Diagnosis Date  . Thyroid disease     No past surgical history on file.  Social History  Substance Use Topics  . Smoking status: Never Smoker   . Smokeless tobacco: None  . Alcohol Use: None    Family History  Problem Relation Age of Onset  . Cancer Mother   . Diabetes Father   . Heart disease Father   . Cancer Maternal Grandmother   . Heart disease Paternal Grandmother   . Heart disease Paternal Grandfather     No Known Allergies  Medication list has been reviewed and updated.  Current Outpatient  Prescriptions on File Prior to Visit  Medication Sig Dispense Refill  . Cholecalciferol (VITAMIN D) 2000 UNITS tablet Take 2,000 Units by mouth daily.    . diphenhydrAMINE (BENADRYL) 25 MG tablet Take 25 mg by mouth every 6 (six) hours as needed for sleep.    Marland Kitchen ibuprofen (ADVIL,MOTRIN) 600 MG tablet Take 1 tablet (600 mg total) by mouth every 6 (six) hours. 30 tablet 0  . Multiple Vitamin (MULTI VITAMIN PO) Take by mouth.    . spironolactone (ALDACTONE) 50 MG tablet Take 50 mg by mouth daily.    Marland Kitchen amoxicillin (AMOXIL) 500 MG capsule Take 1 capsule (500 mg total) by mouth 2 (two) times daily. (Patient not taking: Reported on 10/21/2015) 20 capsule 0  . levothyroxine (SYNTHROID, LEVOTHROID) 175 MCG tablet Take 137 mcg by mouth daily before breakfast.     . Prenatal Vit-Fe Fumarate-FA (PRENATAL MULTIVITAMIN) TABS tablet Take 1 tablet by mouth daily at 12 noon. Reported on 10/21/2015    . ranitidine (ZANTAC) 75 MG tablet Take 75 mg by mouth 2 (two) times daily. Reported on 10/21/2015     No current facility-administered medications on file prior to visit.    Review of Systems:  As per HPI- otherwise negative.  Physical Examination: Filed Vitals:   10/21/15 0910  BP: 120/80  Pulse: 97  Temp: 97.4 F (36.3 C)  Resp: 18   Filed Vitals:   10/21/15 0910  Height:  (1.651 m)  Weight: 212 lb 9.6 oz (96.435 kg)   Body mass index is 35.38 kg/(m^2). Ideal Body Weight: Weight in (lb) to have BMI = 25: 149.9  GEN: WDWN, NAD, Non-toxic, A & O x 3, obese, looks well but does have urticaria HEENT: Atraumatic, Normocephalic. Neck supple. No masses, No LAD.  Bilateral TM wnl, oropharynx normal.  PEERL,EOMI.   No angioedema Ears and Nose: No external deformity. CV: RRR, No M/G/R. No JVD. No thrill. No extra heart sounds. PULM: CTA B, no wheezes, crackles, rhonchi. No retractions. No resp. distress. No accessory muscle use. EXTR: No c/c/e NEURO Normal gait.  PSYCH: Normally interactive.  Conversant. Not depressed or anxious appearing.  Calm demeanor.  Skin: she has blanching hives on her trunk, limbs, right eyelid.  Also dermatographism   Assessment and Plan: Allergic urticaria - Plan: predniSONE (DELTASONE) 20 MG tablet, EPINEPHrine 0.3 mg/0.3 mL IJ SOAJ injection  Here today with urticaria- no evidence of anaphylaxis or angioedema  Will treat with po prednisone. Oral antihistamine, H2 blocker Given rx for epipen and discussed use She will seek care if not better in the next 1-2 days   Signed Abbe Amsterdam, MD

## 2015-11-06 MED FILL — SPIRONOLACTONE 50 MG TABLET: 50 | 30 days supply | Qty: 30 | Fill #4

## 2015-11-06 MED FILL — LEVOTHYROXINE 137 MCG TAB: 137 | 30 days supply | Qty: 30 | Fill #1

## 2015-11-28 DIAGNOSIS — Z6836 Body mass index (BMI) 36.0-36.9, adult: Secondary | ICD-10-CM | POA: Diagnosis not present

## 2015-11-28 DIAGNOSIS — Z1329 Encounter for screening for other suspected endocrine disorder: Secondary | ICD-10-CM | POA: Diagnosis not present

## 2015-11-28 DIAGNOSIS — E669 Obesity, unspecified: Secondary | ICD-10-CM | POA: Diagnosis not present

## 2015-11-28 DIAGNOSIS — E039 Hypothyroidism, unspecified: Secondary | ICD-10-CM | POA: Diagnosis not present

## 2015-11-28 DIAGNOSIS — Z0000 Encounter for general adult medical examination without abnormal findings: Secondary | ICD-10-CM | POA: Diagnosis not present

## 2015-11-28 DIAGNOSIS — Z131 Encounter for screening for diabetes mellitus: Secondary | ICD-10-CM | POA: Diagnosis not present

## 2015-11-28 DIAGNOSIS — Z6835 Body mass index (BMI) 35.0-35.9, adult: Secondary | ICD-10-CM | POA: Diagnosis not present

## 2015-12-01 MED FILL — CONTRAVE ER 8-90 MG TABLET: 8-90 | 30 days supply | Qty: 70 | Fill #0

## 2015-12-04 MED FILL — LEVOTHYROXINE 137 MCG TAB: 137 | 90 days supply | Qty: 90 | Fill #2

## 2015-12-04 MED FILL — SPIRONOLACTONE 50 MG TABLET: 50 | 90 days supply | Qty: 90 | Fill #5

## 2016-01-01 ENCOUNTER — Ambulatory Visit (INDEPENDENT_AMBULATORY_CARE_PROVIDER_SITE_OTHER): Payer: 59 | Admitting: Family Medicine

## 2016-01-01 VITALS — BP 116/78 | HR 76 | Temp 97.9°F | Resp 18 | Ht 65.0 in | Wt 204.0 lb

## 2016-01-01 DIAGNOSIS — N3090 Cystitis, unspecified without hematuria: Secondary | ICD-10-CM

## 2016-01-01 DIAGNOSIS — R300 Dysuria: Secondary | ICD-10-CM

## 2016-01-01 LAB — POC MICROSCOPIC URINALYSIS (UMFC): MUCUS RE: ABSENT

## 2016-01-01 LAB — POCT URINALYSIS DIP (MANUAL ENTRY)
BILIRUBIN UA: NEGATIVE
Bilirubin, UA: NEGATIVE
Glucose, UA: NEGATIVE
Nitrite, UA: POSITIVE — AB
PH UA: 5.5
Protein Ur, POC: NEGATIVE
Spec Grav, UA: <=1.005
Urobilinogen, UA: 0.2

## 2016-01-01 MED ORDER — NITROFURANTOIN MONOHYD MACRO 100 MG PO CAPS: 100.0000 mg | ORAL_CAPSULE | Freq: Two times a day (BID) | ORAL | Status: DC

## 2016-01-01 MED FILL — NITROFURANTOIN MONO-MCR 100: 100 | 7 days supply | Qty: 14 | Fill #0

## 2016-01-01 NOTE — Progress Notes (Signed)
Subjective:    Patient ID: Shannon MacadamiaJennifer M Bringhurst, female    DOB: 1975/12/04, 40 y.o.   MRN: [id number]  HPI This is a 40 yo female who presents today with 3 days of burning with urination. Some improvement with Azo and fluids. She has not had any back pain or fever. No nausea or vomiting. Has IUD for [employer]. No vaginal discharge, itching or burning.   Taking Contrave for weight loss. Has lost 12 pounds in 6 weeks. Some gi side effects, but feels that she his tolerating well.   Past Medical History  Diagnosis Date  . Thyroid disease    History reviewed. No pertinent past surgical history. Family History  Problem Relation Age of Onset  . Cancer Mother   . Diabetes Father   . Heart disease Father   . Cancer Maternal Grandmother   . Heart disease Paternal Grandmother   . Heart disease Paternal Grandfather    Social History  Substance Use Topics  . Smoking status: Never Smoker   . Smokeless tobacco: None  . Alcohol Use: None     Review of Systems Per HPI     Objective:   Physical Exam  Constitutional: She is oriented to person, place, and time. She appears well-developed and well-nourished.  HENT:  Head: Normocephalic and atraumatic.  Eyes: Conjunctivae are normal.  Cardiovascular: Normal rate, regular rhythm and normal heart sounds.   Pulmonary/Chest: Effort normal.  Abdominal: Soft. Bowel sounds are normal. There is tenderness (mild, lower abdominal).  Musculoskeletal: Normal range of motion.  Neurological: She is alert and oriented to person, place, and time.  Skin: Skin is warm and dry.  Psychiatric: She has a normal mood and affect. Her behavior is normal. Judgment and thought content normal.  Vitals reviewed.     BP 116/78 mmHg  Pulse 76  Temp(Src) 97.9 F (36.6 C) (Oral)  Resp 18  Ht 5\' 5"  (1.651 m)  Wt 204 lb (92.534 kg)  BMI 33.95 kg/m2  SpO2 99%  LMP 12/12/2015 Wt Readings from Last 3 Encounters:  01/01/16 204 lb (92.534 kg)  10/21/15 212 lb 9.6 oz (96.435  kg)  06/30/15 209 lb (94.802 kg)   Results for orders placed or performed in visit on 01/01/16  POCT Microscopic Urinalysis ([facility])  Result Value Ref Range   WBC,UR,HPF,POC Moderate (A) None WBC/hpf   RBC,UR,HPF,POC Few (A) None RBC/hpf   Bacteria Moderate (A) None, Too numerous to count   Mucus Absent Absent   Epithelial Cells, UR Per Microscopy Moderate (A) None, Too numerous to count cells/hpf  POCT urinalysis dipstick  Result Value Ref Range   Color, UA yellow yellow   Clarity, UA cloudy (A) clear   Glucose, UA negative negative   Bilirubin, UA negative negative   Ketones, POC UA negative negative   Spec Grav, UA <=1.005    Blood, UA moderate (A) negative   pH, UA 5.5    Protein Ur, POC negative negative   Urobilinogen, UA 0.2    Nitrite, UA Positive (A) Negative   Leukocytes, UA small (1+) (A) Negative        Assessment & Plan:  1. Burning with urination - POCT Microscopic Urinalysis ([facility]) - POCT urinalysis dipstick  2. Cystitis -Provided written and verbal information regarding diagnosis and treatment. - RTC/ED precautions reviewed - nitrofurantoin, macrocrystal-monohydrate, (MACROBID) 100 MG capsule; Take 1 capsule (100 mg total) by mouth 2 (two) times daily.  Dispense: 14 capsule; Refill: 0 - Urine culture  Olean Reeeborah Gessner, FNP-BC  [facility]  01/01/2016 9:48 AM

## 2016-01-01 NOTE — Patient Instructions (Addendum)
 Urinary Tract Infection Urinary tract infections (UTIs) can develop anywhere along your urinary tract. Your urinary tract is your body's drainage system for removing wastes and extra water. Your urinary tract includes two kidneys, two ureters, a bladder, and a urethra. Your kidneys are a pair of bean-shaped organs. Each kidney is about the size of your fist. They are located below your ribs, one on each side of your spine. CAUSES Infections are caused by microbes, which are microscopic organisms, including fungi, viruses, and bacteria. These organisms are so small that they can only be seen through a microscope. Bacteria are the microbes that most commonly cause UTIs. SYMPTOMS  Symptoms of UTIs may vary by age and gender of the patient and by the location of the infection. Symptoms in young women typically include a frequent and intense urge to urinate and a painful, burning feeling in the bladder or urethra during urination. Older women and men are more likely to be tired, shaky, and weak and have muscle aches and abdominal pain. A fever may mean the infection is in your kidneys. Other symptoms of a kidney infection include pain in your back or sides below the ribs, nausea, and vomiting. DIAGNOSIS To diagnose a UTI, your caregiver will ask you about your symptoms. Your caregiver will also ask you to provide a urine sample. The urine sample will be tested for bacteria and white blood cells. White blood cells are made by your body to help fight infection. TREATMENT  Typically, UTIs can be treated with medication. Because most UTIs are caused by a bacterial infection, they usually can be treated with the use of antibiotics. The choice of antibiotic and length of treatment depend on your symptoms and the type of bacteria causing your infection. HOME CARE INSTRUCTIONS  If you were prescribed antibiotics, take them exactly as your caregiver instructs you. Finish the medication even if you feel better  after you have only taken some of the medication.  Drink enough water and fluids to keep your urine clear or pale yellow.  Avoid caffeine, tea, and carbonated beverages. They tend to irritate your bladder.  Empty your bladder often. Avoid holding urine for long periods of time.  Empty your bladder before and after sexual intercourse.  After a bowel movement, women should cleanse from front to back. Use each tissue only once. SEEK MEDICAL CARE IF:   You have back pain.  You develop a fever.  Your symptoms do not begin to resolve within 3 days. SEEK IMMEDIATE MEDICAL CARE IF:   You have severe back pain or lower abdominal pain.  You develop chills.  You have nausea or vomiting.  You have continued burning or discomfort with urination. MAKE SURE YOU:   Understand these instructions.  Will watch your condition.  Will get help right away if you are not doing well or get worse.   This information is not intended to replace advice given to you by your health care provider. Make sure you discuss any questions you have with your health care provider.   Document Released: 06/05/2005 Document Revised: 05/17/2015 Document Reviewed: 10/04/2011 Elsevier Interactive Patient Education 2016 Elsevier Inc.    IF you received an x-ray today, you will receive an invoice from [facility]. Please contact [facility] at [phone] with questions or concerns regarding your invoice.   IF you received labwork today, you will receive an invoice from [facility]. Please contact [facility] at [phone] with questions or concerns regarding your invoice.     Our billing staff will not be able to assist you with questions regarding bills from these companies.  You will be contacted with the lab results as soon as they are available. The fastest way to get your results is to activate your My Chart account. Instructions are located on the last page of this  paperwork. If you have not heard from us regarding the results in 2 weeks, please contact this office.     

## 2016-01-03 LAB — URINE CULTURE: Colony Count: 40000

## 2016-01-03 MED FILL — CONTRAVE ER 8-90 MG TABLET: 8-90 | 30 days supply | Qty: 120 | Fill #0

## 2016-02-13 MED FILL — CONTRAVE ER 8-90 MG TABLET: 8-90 | 30 days supply | Qty: 120 | Fill #0

## 2016-02-20 DIAGNOSIS — Z6832 Body mass index (BMI) 32.0-32.9, adult: Secondary | ICD-10-CM | POA: Diagnosis not present

## 2016-02-20 DIAGNOSIS — E669 Obesity, unspecified: Secondary | ICD-10-CM | POA: Diagnosis not present

## 2016-03-11 MED FILL — CONTRAVE ER 8-90 MG TABLET: 8-90 | 30 days supply | Qty: 120 | Fill #0

## 2016-03-11 MED FILL — LEVOTHYROXINE 137 MCG TAB: 137 | 90 days supply | Qty: 90 | Fill #3

## 2016-04-29 MED FILL — CONTRAVE ER 8-90 MG TABLET: 8-90 | 30 days supply | Qty: 120 | Fill #1

## 2016-05-23 DIAGNOSIS — E039 Hypothyroidism, unspecified: Secondary | ICD-10-CM | POA: Diagnosis not present

## 2016-05-23 DIAGNOSIS — L709 Acne, unspecified: Secondary | ICD-10-CM | POA: Diagnosis not present

## 2016-05-23 DIAGNOSIS — Z13220 Encounter for screening for lipoid disorders: Secondary | ICD-10-CM | POA: Diagnosis not present

## 2016-05-23 DIAGNOSIS — Z23 Encounter for immunization: Secondary | ICD-10-CM | POA: Diagnosis not present

## 2016-05-23 LAB — LIPID PANEL
Cholesterol: 141 (ref 0–200)
HDL: 62 (ref 35–70)
LDL CALC: 70
TRIGLYCERIDES: 43 (ref 40–160)

## 2016-05-23 LAB — BASIC METABOLIC PANEL
BUN: 20 (ref 4–21)
CREATININE: 0.7 (ref 0.5–1.1)
GLUCOSE: 99
Potassium: 4.4 (ref 3.4–5.3)
SODIUM: 137 (ref 137–147)

## 2016-05-23 LAB — TSH: TSH: 2.09 (ref 0.41–5.90)

## 2016-06-10 MED FILL — LEVOTHYROXINE 137 MCG TAB: 137 | 90 days supply | Qty: 90 | Fill #4

## 2016-06-10 MED FILL — CONTRAVE ER 8-90 MG TABLET: 8-90 | 30 days supply | Qty: 120 | Fill #2

## 2016-07-18 DIAGNOSIS — E669 Obesity, unspecified: Secondary | ICD-10-CM | POA: Diagnosis not present

## 2016-07-18 DIAGNOSIS — Z6831 Body mass index (BMI) 31.0-31.9, adult: Secondary | ICD-10-CM | POA: Diagnosis not present

## 2016-07-18 MED FILL — CONTRAVE ER 8-90 MG TABLET: 8-90 | 30 days supply | Qty: 120 | Fill #0

## 2016-08-20 MED FILL — CONTRAVE ER 8-90 MG TABLET: 8-90 | 30 days supply | Qty: 120 | Fill #1

## 2016-09-26 MED FILL — LEVOTHYROXINE 137 MCG TAB: 137 | 30 days supply | Qty: 30 | Fill #0

## 2016-09-27 MED FILL — CONTRAVE ER 8-90 MG TABLET: 8-90 | 30 days supply | Qty: 120 | Fill #2

## 2016-10-18 DIAGNOSIS — B349 Viral infection, unspecified: Secondary | ICD-10-CM | POA: Diagnosis not present

## 2016-10-28 MED FILL — LEVOTHYROXINE 137 MCG TAB: 137 | 90 days supply | Qty: 90 | Fill #1

## 2017-01-02 DIAGNOSIS — Z01419 Encounter for gynecological examination (general) (routine) without abnormal findings: Secondary | ICD-10-CM | POA: Diagnosis not present

## 2017-01-02 DIAGNOSIS — Z1151 Encounter for screening for human papillomavirus (HPV): Secondary | ICD-10-CM | POA: Diagnosis not present

## 2017-01-02 DIAGNOSIS — Z6830 Body mass index (BMI) 30.0-30.9, adult: Secondary | ICD-10-CM | POA: Diagnosis not present

## 2017-01-02 LAB — HM PAP SMEAR: HM PAP: NEGATIVE

## 2017-01-13 MED FILL — BELVIQ XR 20 MG TABLET: 20 | 30 days supply | Qty: 30 | Fill #0

## 2017-01-15 MED FILL — TRANEXAMIC ACID 650 MG TAB: 650 | 30 days supply | Qty: 30 | Fill #0

## 2017-02-12 MED FILL — LEVOTHYROXINE 137 MCG TAB: 137 | 60 days supply | Qty: 60 | Fill #2

## 2017-02-12 MED FILL — BELVIQ XR 20 MG TABLET: 20 | 30 days supply | Qty: 30 | Fill #1

## 2017-02-12 MED FILL — TRANEXAMIC ACID 650 MG TAB: 650 | 30 days supply | Qty: 30 | Fill #1

## 2017-02-18 MED FILL — BUPROPION HCL XL 150 MG TAB: 150 | 30 days supply | Qty: 30 | Fill #0

## 2017-03-24 MED FILL — TRANEXAMIC ACID 650 MG TAB: 650 | 30 days supply | Qty: 30 | Fill #2

## 2017-03-24 MED FILL — BUPROPION HCL XL 150 MG TAB: 150 | 30 days supply | Qty: 30 | Fill #0

## 2017-03-28 DIAGNOSIS — Z6830 Body mass index (BMI) 30.0-30.9, adult: Secondary | ICD-10-CM | POA: Diagnosis not present

## 2017-03-28 DIAGNOSIS — E669 Obesity, unspecified: Secondary | ICD-10-CM | POA: Diagnosis not present

## 2017-04-08 ENCOUNTER — Telehealth: Payer: Self-pay | Admitting: Emergency Medicine

## 2017-04-08 ENCOUNTER — Encounter: Payer: Self-pay | Admitting: Emergency Medicine

## 2017-04-08 NOTE — Telephone Encounter (Signed)
PreVisit call completed   Patient is in need of TDap and states she is up to date with PAP Smear.

## 2017-04-09 MED FILL — BUPROPION HCL XL 300 MG TAB: 300 | 30 days supply | Qty: 30 | Fill #0

## 2017-04-10 ENCOUNTER — Ambulatory Visit (INDEPENDENT_AMBULATORY_CARE_PROVIDER_SITE_OTHER): Payer: 59 | Admitting: Family Medicine

## 2017-04-10 ENCOUNTER — Encounter: Payer: Self-pay | Admitting: Family Medicine

## 2017-04-10 VITALS — BP 110/74 | HR 85 | Temp 98.5°F | Ht 65.0 in | Wt 183.0 lb

## 2017-04-10 DIAGNOSIS — E669 Obesity, unspecified: Secondary | ICD-10-CM

## 2017-04-10 DIAGNOSIS — E039 Hypothyroidism, unspecified: Secondary | ICD-10-CM | POA: Diagnosis not present

## 2017-04-10 DIAGNOSIS — E038 Other specified hypothyroidism: Secondary | ICD-10-CM | POA: Insufficient documentation

## 2017-04-10 HISTORY — DX: Hypothyroidism, unspecified: E03.9

## 2017-04-10 HISTORY — DX: Obesity, unspecified: E66.9

## 2017-04-10 LAB — T4, FREE: Free T4: 1.34 ng/dL (ref 0.60–1.60)

## 2017-04-10 LAB — CBC WITH DIFFERENTIAL/PLATELET
Basophils Absolute: 0.1 10*3/uL (ref 0.0–0.1)
Basophils Relative: 0.6 % (ref 0.0–3.0)
Eosinophils Absolute: 0.1 10*3/uL (ref 0.0–0.7)
Eosinophils Relative: 1.4 % (ref 0.0–5.0)
HCT: 42.3 % (ref 36.0–46.0)
Hemoglobin: 14.2 g/dL (ref 12.0–15.0)
Lymphocytes Relative: 32.1 % (ref 12.0–46.0)
Lymphs Abs: 2.7 10*3/uL (ref 0.7–4.0)
MCHC: 33.6 g/dL (ref 30.0–36.0)
MCV: 94.1 fl (ref 78.0–100.0)
Monocytes Absolute: 0.6 10*3/uL (ref 0.1–1.0)
Monocytes Relative: 7.3 % (ref 3.0–12.0)
Neutro Abs: 4.9 10*3/uL (ref 1.4–7.7)
Neutrophils Relative %: 58.6 % (ref 43.0–77.0)
Platelets: 281 10*3/uL (ref 150.0–400.0)
RBC: 4.5 Mil/uL (ref 3.87–5.11)
RDW: 13.6 % (ref 11.5–15.5)
WBC: 8.4 10*3/uL (ref 4.0–10.5)

## 2017-04-10 LAB — COMPREHENSIVE METABOLIC PANEL
ALT: 9 U/L (ref 0–35)
AST: 10 U/L (ref 0–37)
Albumin: 4.3 g/dL (ref 3.5–5.2)
Alkaline Phosphatase: 42 U/L (ref 39–117)
BUN: 14 mg/dL (ref 6–23)
CO2: 29 mEq/L (ref 19–32)
Calcium: 9.7 mg/dL (ref 8.4–10.5)
Chloride: 103 mEq/L (ref 96–112)
Creatinine, Ser: 0.75 mg/dL (ref 0.40–1.20)
GFR: 91.3 mL/min (ref >60.00)
Glucose, Bld: 76 mg/dL (ref 70–99)
Potassium: 5 mEq/L (ref 3.5–5.1)
Sodium: 137 mEq/L (ref 135–145)
Total Bilirubin: 0.5 mg/dL (ref 0.2–1.2)
Total Protein: 6.6 g/dL (ref 6.0–8.3)

## 2017-04-10 LAB — TSH: TSH: 7.28 u[IU]/mL — ABNORMAL HIGH (ref 0.35–4.50)

## 2017-04-10 NOTE — Progress Notes (Signed)
Shannon Wagner is a 41 y.o. female [facility]   Patient Care Team: Shannon Wagner, Shannon Hartinger, DO as PCP - General (Family Medicine)   History of Present Illness:   Shannon Wagner acting as scribe for Shannon Wagner.  HPI: Patient comes in today to Establish care. She is wanting to have thyroid labs today. She was previously with [facility]. 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   Hypothyroid: She has been on thyroid medication for 10-12 years and stable on this. Will draw labs today to see if she is still stable.   Weight loss: She has tried Contrave in the past with success. This was too expensive to continue. She is currently taking Wellbutrin. Her GYN is following weight loss. Would like to stay with her right now.   Health Maintenance Due  Topic Date Due  . TETANUS/TDAP  12/27/1996  . PAP SMEAR  12/28/1998  . INFLUENZA VACCINE  04/09/2017   Depression screen PHQ 2/9 04/10/2017  Decreased Interest 0  Down, Depressed, Hopeless 0  PHQ - 2 Score 0   PMHx, SurgHx, SocialHx, Medications, and Allergies were reviewed in the Visit Navigator and updated as appropriate.   Past Medical History:  Diagnosis Date  . Acquired hypothyroidism 04/10/2017  . Obesity (BMI 30-39.9) 04/10/2017   No past surgical history on file.  Family History  Problem Relation Age of Onset  . Cancer Mother   . Diabetes Father   . Heart disease Father   . Cancer Maternal Grandmother   . Heart disease Paternal Grandmother   . Heart disease Paternal Grandfather    Social History  Substance Use Topics  . Smoking status: Never Smoker  . Smokeless tobacco: Never Used  . Alcohol use Yes     Comment: social    Current Medications and Allergies:   .  buPROPion (WELLBUTRIN XL) 300 MG 24 hr tablet, Take 300 mg by mouth daily., Disp: , Rfl:  .  Cholecalciferol (VITAMIN D) 2000 UNITS tablet, Take 2,000 Units by mouth daily., Disp: , Rfl:  .  levothyroxine (SYNTHROID, LEVOTHROID) 137 MCG tablet,  , Disp: , Rfl: 12 .  Multiple Vitamin (MULTI VITAMIN PO), Take by mouth., Disp: , Rfl:  .  tranexamic acid (LYSTEDA) 650 MG TABS tablet, TAKE 2 TABLETS BY MOUTH THREE TIMES DAILY ON DAYS 1 5 OF CYCLE, Disp: , Rfl: 5  No Known Allergies   Review of Systems:   Pertinent items are noted in the HPI. Otherwise, ROS is negative.  Vitals:   Vitals:   04/10/17 0954  BP: 110/74  Pulse: 85  Temp: 98.5 F (36.9 C)  TempSrc: Oral  SpO2: 99%  Weight: 183 lb (83 kg)  Height: 5\' 5"  (1.651 m)     Body mass index is 30.45 kg/m.  Physical Exam:   Physical Exam  Constitutional: She is oriented to person, place, and time. She appears well-developed and well-nourished. No distress.  HENT:  Head: Normocephalic and atraumatic.  Right Ear: External ear normal.  Left Ear: External ear normal.  Nose: Nose normal.  Mouth/Throat: Oropharynx is clear and moist.  Eyes: Pupils are equal, round, and reactive to light. EOM are normal.  Neck: Normal range of motion. Neck supple. No thyromegaly present.  Cardiovascular: Normal rate, regular rhythm, normal heart sounds and intact distal pulses.   No murmur heard. Pulmonary/Chest: Effort normal and breath sounds normal.  Abdominal: Soft. Bowel sounds are normal.  Musculoskeletal: Normal range of motion.  Neurological: She is alert and oriented to person, place, and time.  Skin: Skin is warm. Capillary refill takes less than 2 seconds.  Psychiatric: She has a normal mood and affect. Her behavior is normal.  Nursing note and vitals reviewed.  Assessment and Plan:   Diagnoses and all orders for this visit:  Acquired hypothyroidism -     T4, free -     TSH -     CBC with Differential/Platelet -     Comprehensive metabolic panel  Obesity (BMI 30-39.9) Comments: She is doing well. Continue Wellbutrin. Discussed healthy food choices and exercise.   Wt Readings from Last 3 Encounters:  04/10/17 183 lb (83 kg)  01/01/16 204 lb (92.5 kg)  10/21/15  212 lb 9.6 oz (96.4 kg)   Orders: -     T4, free -     TSH -     CBC with Differential/Platelet -     Comprehensive metabolic panel   . Reviewed expectations re: course of current medical issues. . Discussed self-management of symptoms. . Outlined signs and symptoms indicating need for more acute intervention. . Patient verbalized understanding and all questions were answered. Marland Kitchen. Health Maintenance issues including appropriate healthy diet, exercise, and smoking avoidance were discussed with patient. . See orders for this visit as documented in the electronic medical record. . Patient received an After Visit Summary.  Wagner served as Neurosurgeonscribe during this visit. History, Physical, and Plan performed by medical provider. The above documentation has been reviewed and is accurate and complete. Shannon RimaErica Emilygrace Grothe, D.O.  Shannon RimaErica Nattaly Yebra, DO [place] 04/10/2017  Future Appointments Date Time Provider Department Center  10/13/2017 10:00 AM Shannon Wagner, Faron Whitelock, DO [facility] None

## 2017-04-15 ENCOUNTER — Encounter: Payer: Self-pay | Admitting: Family Medicine

## 2017-04-16 ENCOUNTER — Telehealth: Payer: Self-pay | Admitting: Family Medicine

## 2017-04-16 NOTE — Telephone Encounter (Signed)
Patient called in reference to having levothyroxine (SYNTHROID, LEVOTHROID) 137 MCG tablet filled for whatever dose Shannon Wagner seems to be appropriate since patient stated her labs were a little "off".  Please call patient and advise when filled. OK to leave message.

## 2017-04-17 ENCOUNTER — Other Ambulatory Visit: Payer: Self-pay

## 2017-04-17 DIAGNOSIS — E039 Hypothyroidism, unspecified: Secondary | ICD-10-CM

## 2017-04-17 MED ORDER — LEVOTHYROXINE SODIUM 150 MCG PO TABS: 150.0000 ug | ORAL_TABLET | Freq: Every day | ORAL | 3 refills | Status: DC

## 2017-04-17 MED FILL — LEVOTHYROXINE 150 MCG TAB: 150 | 90 days supply | Qty: 90 | Fill #0

## 2017-04-17 NOTE — Telephone Encounter (Signed)
Patient confirmed that she is taking Synthroid properly.  New Rx sent in for 150 mcg daily.  Please see other messages and lab notes for documentation from Dr. Earlene PlaterWallace.

## 2017-04-18 MED FILL — TRANEXAMIC ACID 650 MG TAB: 650 | 30 days supply | Qty: 30 | Fill #3

## 2017-04-23 ENCOUNTER — Telehealth: Payer: Self-pay | Admitting: Family Medicine

## 2017-04-23 NOTE — Telephone Encounter (Signed)
ROI fax to [facility] °

## 2017-05-02 NOTE — Telephone Encounter (Signed)
Rec'd from [facility] requested records forwarded 15 pages to [employer] DO

## 2017-05-06 ENCOUNTER — Encounter: Payer: Self-pay | Admitting: Family Medicine

## 2017-05-13 MED FILL — BUPROPION HCL XL 300 MG TAB: 300 | 30 days supply | Qty: 30 | Fill #1

## 2017-05-16 MED FILL — TRANEXAMIC ACID 650 MG TAB: 650 | 30 days supply | Qty: 30 | Fill #4

## 2017-06-10 MED FILL — TRANEXAMIC ACID 650 MG TAB: 650 | 30 days supply | Qty: 30 | Fill #5

## 2017-06-13 MED FILL — BUPROPION HCL XL 300 MG TAB: 300 | 30 days supply | Qty: 30 | Fill #0

## 2017-07-11 MED FILL — TRANEXAMIC ACID 650 MG TAB: 650 | 5 days supply | Qty: 30 | Fill #0

## 2017-07-14 MED FILL — LEVOTHYROXINE 150 MCG TAB: 150 | 90 days supply | Qty: 90 | Fill #1

## 2017-09-01 MED FILL — TRANEXAMIC ACID 650 MG TAB: 650 | 5 days supply | Qty: 30 | Fill #1

## 2017-09-30 MED FILL — TRANEXAMIC ACID 650 MG TAB: 650 | 5 days supply | Qty: 30 | Fill #2

## 2017-10-13 ENCOUNTER — Ambulatory Visit (INDEPENDENT_AMBULATORY_CARE_PROVIDER_SITE_OTHER): Payer: No Typology Code available for payment source | Admitting: Family Medicine

## 2017-10-13 ENCOUNTER — Encounter: Payer: Self-pay | Admitting: Family Medicine

## 2017-10-13 VITALS — BP 110/78 | HR 79 | Temp 98.7°F | Wt 186.2 lb

## 2017-10-13 DIAGNOSIS — R635 Abnormal weight gain: Secondary | ICD-10-CM

## 2017-10-13 DIAGNOSIS — Z0000 Encounter for general adult medical examination without abnormal findings: Secondary | ICD-10-CM

## 2017-10-13 DIAGNOSIS — E559 Vitamin D deficiency, unspecified: Secondary | ICD-10-CM

## 2017-10-13 MED ORDER — PSEUDOEPHEDRINE-GUAIFENESIN ER 60-600 MG PO TB12: 1.0000 | ORAL_TABLET | Freq: Two times a day (BID) | ORAL | 2 refills | Status: DC

## 2017-10-14 ENCOUNTER — Other Ambulatory Visit (INDEPENDENT_AMBULATORY_CARE_PROVIDER_SITE_OTHER): Payer: No Typology Code available for payment source

## 2017-10-14 DIAGNOSIS — E559 Vitamin D deficiency, unspecified: Secondary | ICD-10-CM | POA: Diagnosis not present

## 2017-10-14 DIAGNOSIS — R5383 Other fatigue: Secondary | ICD-10-CM

## 2017-10-14 LAB — COMPREHENSIVE METABOLIC PANEL
ALT: 11 U/L (ref 0–35)
AST: 12 U/L (ref 0–37)
Albumin: 4.5 g/dL (ref 3.5–5.2)
Alkaline Phosphatase: 43 U/L (ref 39–117)
BUN: 11 mg/dL (ref 6–23)
CO2: 28 mEq/L (ref 19–32)
Calcium: 9.3 mg/dL (ref 8.4–10.5)
Chloride: 103 mEq/L (ref 96–112)
Creatinine, Ser: 0.7 mg/dL (ref 0.40–1.20)
GFR: 98.61 mL/min (ref >60.00)
Glucose, Bld: 85 mg/dL (ref 70–99)
Potassium: 4.5 mEq/L (ref 3.5–5.1)
Sodium: 138 mEq/L (ref 135–145)
Total Bilirubin: 0.5 mg/dL (ref 0.2–1.2)
Total Protein: 7.1 g/dL (ref 6.0–8.3)

## 2017-10-14 LAB — T4, FREE: Free T4: 1.29 ng/dL (ref 0.60–1.60)

## 2017-10-14 LAB — CBC WITH DIFFERENTIAL/PLATELET
Basophils Absolute: 0 10*3/uL (ref 0.0–0.1)
Basophils Relative: 0.4 % (ref 0.0–3.0)
Eosinophils Absolute: 0.1 10*3/uL (ref 0.0–0.7)
Eosinophils Relative: 1.2 % (ref 0.0–5.0)
HCT: 43.7 % (ref 36.0–46.0)
Hemoglobin: 15.1 g/dL — ABNORMAL HIGH (ref 12.0–15.0)
Lymphocytes Relative: 33.4 % (ref 12.0–46.0)
Lymphs Abs: 2.2 10*3/uL (ref 0.7–4.0)
MCHC: 34.6 g/dL (ref 30.0–36.0)
MCV: 92.1 fl (ref 78.0–100.0)
Monocytes Absolute: 0.3 10*3/uL (ref 0.1–1.0)
Monocytes Relative: 5.1 % (ref 3.0–12.0)
Neutro Abs: 4 10*3/uL (ref 1.4–7.7)
Neutrophils Relative %: 59.9 % (ref 43.0–77.0)
Platelets: 270 10*3/uL (ref 150.0–400.0)
RBC: 4.75 Mil/uL (ref 3.87–5.11)
RDW: 13.3 % (ref 11.5–15.5)
WBC: 6.7 10*3/uL (ref 4.0–10.5)

## 2017-10-14 LAB — LIPID PANEL
Cholesterol: 146 mg/dL (ref 0–200)
HDL: 65.6 mg/dL (ref >39.00)
LDL Cholesterol: 67 mg/dL (ref 0–99)
NonHDL: 80.33
Total CHOL/HDL Ratio: 2
Triglycerides: 67 mg/dL (ref 0.0–149.0)
VLDL: 13.4 mg/dL (ref 0.0–40.0)

## 2017-10-14 LAB — TSH: TSH: 3.81 u[IU]/mL (ref 0.35–4.50)

## 2017-10-14 LAB — VITAMIN D 25 HYDROXY (VIT D DEFICIENCY, FRACTURES): VITD: 30.89 ng/mL (ref 30.00–100.00)

## 2017-10-16 NOTE — Progress Notes (Signed)
Subjective:    Shannon Wagner is a 42 y.o. female and is here for a comprehensive physical exam.  OB History    Gravida Para Term Preterm AB Living   2 2 2     2    SAB TAB Ectopic Multiple Live Births           1     There are no preventive care reminders to display for this patient.  PMHx, SurgHx, SocialHx, Medications, and Allergies were reviewed in the Visit Navigator and updated as appropriate.   Past Medical History:  Diagnosis Date  . Acquired hypothyroidism 04/10/2017  . Obesity (BMI 30-39.9) 04/10/2017   No past surgical history on file.  Family History  Problem Relation Age of Onset  . Cancer Mother   . Diabetes Father   . Heart disease Father   . Cancer Maternal Grandmother   . Heart disease Paternal Grandmother   . Heart disease Paternal Grandfather    Social History   Tobacco Use  . Smoking status: Never Smoker  . Smokeless tobacco: Never Used  Substance Use Topics  . Alcohol use: Yes    Comment: social   . Drug use: No   Review of Systems:   Pertinent items are noted in the HPI. Otherwise, ROS is negative.  Objective:   BP 110/78   Pulse 79   Temp 98.7 F (37.1 C) (Oral)   Wt 186 lb 3.2 oz (84.5 kg)   SpO2 98%   BMI 30.99 kg/m    Wt Readings from Last 3 Encounters:  10/13/17 186 lb 3.2 oz (84.5 kg)  04/10/17 183 lb (83 kg)  01/01/16 204 lb (92.5 kg)     Ht Readings from Last 3 Encounters:  04/10/17 5\' 5"  (1.651 m)  01/01/16 5\' 5"  (1.651 m)  10/21/15 5\' 5"  (1.651 m)   General appearance: alert, cooperative and appears stated age. Head: normocephalic, without obvious abnormality, atraumatic. Neck: no adenopathy, supple, symmetrical, trachea midline; thyroid not enlarged, symmetric, no tenderness/mass/nodules. Lungs: clear to auscultation bilaterally. Heart: regular rate and rhythm Abdomen: soft, non-tender; no masses,  no organomegaly. Extremities: extremities normal, atraumatic, no cyanosis or edema. Skin: skin color, texture,  turgor normal, no rashes or lesions. Lymph: cervical, supraclavicular, and axillary nodes normal; no abnormal inguinal nodes palpated. Neurologic: grossly normal.  Assessment/Plan:   Shannon Wagner was seen today for annual exam.  Diagnoses and all orders for this visit:  Routine physical examination  Vitamin D deficiency -     VITAMIN D 25 Hydroxy (Vit-D Deficiency, Fractures); Future  Weight gain -     CBC with Differential/Platelet; Future -     Comprehensive metabolic panel; Future -     Lipid panel; Future -     TSH; Future -     T4, free; Future  Patient Counseling:   [x]     Nutrition: Stressed importance of moderation in sodium/caffeine intake, saturated fat and cholesterol, caloric balance, sufficient intake of fresh fruits, vegetables, fiber, calcium, iron, and 1 mg of folate supplement per day (for females capable of pregnancy).   [x]      Stressed the importance of regular exercise.    [x]     Substance Abuse: Discussed cessation/primary prevention of tobacco, alcohol, or other drug use; driving or other dangerous activities under the influence; availability of treatment for abuse.    [x]      Injury prevention: Discussed safety belts, safety helmets, smoke detector, smoking near bedding or upholstery.    [x]   Sexuality: Discussed sexually transmitted diseases, partner selection, use of condoms, avoidance of unintended pregnancy  and contraceptive alternatives.    [x]     Dental health: Discussed importance of regular tooth brushing, flossing, and dental visits.   [x]      Health maintenance and immunizations reviewed. Please refer to Health maintenance section.   Helane Rima, DO [facility]

## 2017-10-23 MED FILL — TRANEXAMIC ACID 650 MG TAB: 650 | 5 days supply | Qty: 30 | Fill #3

## 2017-10-23 MED FILL — LEVOTHYROXINE 150 MCG TAB: 150 | 90 days supply | Qty: 90 | Fill #2

## 2017-12-14 ENCOUNTER — Telehealth: Payer: No Typology Code available for payment source | Admitting: Physician Assistant

## 2017-12-14 DIAGNOSIS — T7840XA Allergy, unspecified, initial encounter: Secondary | ICD-10-CM

## 2017-12-14 MED ORDER — PREDNISONE 10 MG (21) PO TBPK: ORAL_TABLET | ORAL | 0 refills | Status: DC

## 2017-12-14 NOTE — Progress Notes (Signed)
E Visit for Rash  We are sorry that you are not feeling well. Here is how we plan to help!  Based on your symptoms and the picture you have provided, I am concerned for an allergic response to something that you're putting on your face. Avoid use of any make up products or facial lotions until you have a follow up with your primary care provider. I have sent you in a prescription steroid taper to start to help resolve this reaction. Follow the recommendations below. If you know any worsening symptoms or any note of difficulty swallowing or shortness of breath, please seek emergency care.   I recommend you take Benadryl 25 mg - 50 mg every 4 hours to control the symptoms but if they last over 24 hours it is best that you see an office based provider for follow up.  HOME CARE:   Take cool showers and avoid direct sunlight.  Apply cool compress or wet dressings.  Take a bath in an oatmeal bath.  Sprinkle content of one Aveeno packet under running faucet with comfortably warm water.  Bathe for 15-20 minutes, 1-2 times daily.  Pat dry with a towel. Do not rub the rash.  Use hydrocortisone cream.  Take an antihistamine like Benadryl for widespread rashes that itch.  The adult dose of Benadryl is 25-50 mg by mouth 4 times daily.  Caution:  This type of medication may cause sleepiness.  Do not drink alcohol, drive, or operate dangerous machinery while taking antihistamines.  Do not take these medications if you have prostate enlargement.  Read package instructions thoroughly on all medications that you take.  GET HELP RIGHT AWAY IF:   Symptoms don't go away after treatment.  Severe itching that persists.  If you rash spreads or swells.  If you rash begins to smell.  If it blisters and opens or develops a yellow-brown crust.  You develop a fever.  You have a sore throat.  You become short of breath.  MAKE SURE YOU:  Understand these instructions. Will watch your condition. Will  get help right away if you are not doing well or get worse.  Thank you for choosing an e-visit. Your e-visit answers were reviewed by a board certified advanced clinical practitioner to complete your personal care plan. Depending upon the condition, your plan could have included both over the counter or prescription medications. Please review your pharmacy choice. Be sure that the pharmacy you have chosen is open so that you can pick up your prescription now.  If there is a problem you may message your provider in MyChart to have the prescription routed to another pharmacy. Your safety is important to us. If you have drug allergies check your prescription carefully.  For the next 24 hours, you can use MyChart to ask questions about today's visit, request a non-urgent call back, or ask for a work or school excuse from your e-visit provider. You will get an email in the next two days asking about your experience. I hope that your e-visit has been valuable and will speed your recovery.

## 2017-12-22 MED FILL — TRANEXAMIC ACID 650 MG TAB: 650 | 5 days supply | Qty: 30 | Fill #4

## 2017-12-25 ENCOUNTER — Ambulatory Visit (INDEPENDENT_AMBULATORY_CARE_PROVIDER_SITE_OTHER): Payer: No Typology Code available for payment source | Admitting: Physician Assistant

## 2017-12-25 ENCOUNTER — Encounter: Payer: Self-pay | Admitting: Physician Assistant

## 2017-12-25 VITALS — BP 100/62 | HR 82 | Temp 99.0°F | Resp 16 | Ht 64.0 in | Wt 189.0 lb

## 2017-12-25 DIAGNOSIS — J020 Streptococcal pharyngitis: Secondary | ICD-10-CM | POA: Diagnosis not present

## 2017-12-25 LAB — POCT RAPID STREP A (OFFICE): Rapid Strep A Screen: POSITIVE — AB

## 2017-12-25 MED ORDER — AMOXICILLIN 875 MG PO TABS: 875.0000 mg | ORAL_TABLET | Freq: Two times a day (BID) | ORAL | 0 refills | Status: DC

## 2017-12-25 MED FILL — AMOXICILLIN 875 MG TABLET: 875 | 10 days supply | Qty: 20 | Fill #0

## 2017-12-25 NOTE — Progress Notes (Signed)
Shannon Wagner is a 42 y.o. female here for a new problem.  SCRIBE STATEMENT  History of Present Illness:   Chief Complaint  Patient presents with  . Sore Throat    Back of left throat since 12/24/17. Tried ibuprofen, worsening, hurts to swallow. Achy.   . Fever    101.6 this AM.    URI   This is a new problem. The current episode started yesterday. The problem has been gradually worsening. The maximum temperature recorded prior to her arrival was 101 - 101.9 F. The fever has been present for less than 1 day. Associated symptoms include congestion, a sore throat and swollen glands. Pertinent negatives include no abdominal pain, coughing, headaches, nausea, neck pain, plugged ear sensation or rhinorrhea. She has tried increased fluids and NSAIDs for the symptoms.    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   Past Medical History:  Diagnosis Date  . Acquired hypothyroidism 04/10/2017  . Obesity (BMI 30-39.9) 04/10/2017     Social History   Socioeconomic History  . Marital status: Married    Spouse name: Not on file  . Number of children: Not on file  . Years of education: Not on file  . Highest education level: Not on file  Occupational History  . Not on file  Social Needs  . Financial resource strain: Not on file  . Food insecurity:    Worry: Not on file    Inability: Not on file  . Transportation needs:    Medical: Not on file    Non-medical: Not on file  Tobacco Use  . Smoking status: Never Smoker  . Smokeless tobacco: Never Used  Substance and Sexual Activity  . Alcohol use: Yes    Comment: social   . Drug use: No  . Sexual activity: Yes  Lifestyle  . Physical activity:    Days per week: Not on file    Minutes per session: Not on file  . Stress: Not on file  Relationships  . Social connections:    Talks on phone: Not on file    Gets together: Not on file    Attends religious service: Not on file    Active member of club or organization: Not on file    Attends meetings of  clubs or organizations: Not on file    Relationship status: Not on file  . Intimate partner violence:    Fear of current or ex partner: Not on file    Emotionally abused: Not on file    Physically abused: Not on file    Forced sexual activity: Not on file  Other Topics Concern  . Not on file  Social History Narrative  . Not on file    History reviewed. No pertinent surgical history.  Family History  Problem Relation Age of Onset  . Cancer Mother   . Diabetes Father   . Heart disease Father   . Cancer Maternal Grandmother   . Heart disease Paternal Grandmother   . Heart disease Paternal Grandfather     No Known Allergies  Current Medications:   Current Outpatient Medications:  .  Cholecalciferol (VITAMIN D) 2000 UNITS tablet, Take 2,000 Units by mouth daily., Disp: , Rfl:  .  diphenhydrAMINE (BENADRYL) 25 MG tablet, Take 25 mg by mouth every 6 (six) hours as needed for sleep., Disp: , Rfl:  .  ibuprofen (ADVIL,MOTRIN) 600 MG tablet, Take 1 tablet (600 mg total) by mouth every 6 (six) hours. (Patient taking differently:  Take 600 mg by mouth every 6 (six) hours as needed. ), Disp: 30 tablet, Rfl: 0 .  levothyroxine (SYNTHROID, LEVOTHROID) 150 MCG tablet, Take 1 tablet (150 mcg total) by mouth daily., Disp: 90 tablet, Rfl: 3 .  Multiple Vitamin (MULTI VITAMIN PO), Take by mouth., Disp: , Rfl:  .  pseudoephedrine-guaifenesin (MUCINEX D) 60-600 MG 12 hr tablet, Take 1 tablet by mouth every 12 (twelve) hours. (Patient taking differently: Take 1 tablet by mouth every 12 (twelve) hours as needed. ), Disp: 60 tablet, Rfl: 2 .  tranexamic acid (LYSTEDA) 650 MG TABS tablet, TAKE 2 TABLETS BY MOUTH THREE TIMES DAILY ON DAYS 1 5 OF CYCLE, Disp: , Rfl: 5 .  amoxicillin (AMOXIL) 875 MG tablet, Take 1 tablet (875 mg total) by mouth 2 (two) times daily., Disp: 20 tablet, Rfl: 0   Review of Systems:   Review of Systems  HENT: Positive for congestion and sore throat. Negative for rhinorrhea.    Respiratory: Negative for cough.   Gastrointestinal: Negative for abdominal pain and nausea.  Musculoskeletal: Negative for neck pain.  Neurological: Negative for headaches.    Vitals:   Vitals:   12/25/17 0935  BP: 100/62  Pulse: 82  Resp: 16  Temp: 99 F (37.2 C)  TempSrc: Oral  SpO2: 97%  Weight: 189 lb (85.7 kg)  Height: 5\' 4"  (1.626 m)     Body mass index is 32.44 kg/m.  Physical Exam:   Physical Exam  Constitutional: She appears well-developed. She is cooperative.  Non-toxic appearance. She does not have a sickly appearance. She does not appear ill. No distress.  HENT:  Head: Normocephalic and atraumatic.  Right Ear: Tympanic membrane, external ear and ear canal normal. Tympanic membrane is not erythematous, not retracted and not bulging.  Left Ear: Tympanic membrane, external ear and ear canal normal. Tympanic membrane is not erythematous, not retracted and not bulging.  Nose: Nose normal. Right sinus exhibits no maxillary sinus tenderness and no frontal sinus tenderness. Left sinus exhibits no maxillary sinus tenderness and no frontal sinus tenderness.  Mouth/Throat: Uvula is midline and mucous membranes are normal. Posterior oropharyngeal edema and posterior oropharyngeal erythema present. Tonsils are 2+ on the right. Tonsils are 2+ on the left. Tonsillar exudate.  Eyes: Conjunctivae and lids are normal.  Neck: Trachea normal.  Cardiovascular: Normal rate, regular rhythm, S1 normal, S2 normal and normal heart sounds.  Pulmonary/Chest: Effort normal and breath sounds normal. She has no decreased breath sounds. She has no wheezes. She has no rhonchi. She has no rales.  Lymphadenopathy:    She has cervical adenopathy.  Neurological: She is alert.  Skin: Skin is warm, dry and intact.  Psychiatric: She has a normal mood and affect. Her speech is normal and behavior is normal.  Nursing note and vitals reviewed.  Results for orders placed or performed in visit on  12/25/17  POCT rapid strep A  Result Value Ref Range   Rapid Strep A Screen Positive (A) Negative     Assessment and Plan:    Shannon Wagner was seen today for sore throat and fever.  Diagnoses and all orders for this visit:  Strep pharyngitis Positive rapid strep. Will initiate Amoxicillin per orders. Discussed taking medications as prescribed. Reviewed return precautions including worsening fever, SOB, worsening cough or other concerns. Push fluids and rest. I recommend that patient follow-up if symptoms worsen or persist despite treatment x 7-10 days, sooner if needed. -     POCT rapid strep A  Other orders -     amoxicillin (AMOXIL) 875 MG tablet; Take 1 tablet (875 mg total) by mouth 2 (two) times daily.    . Reviewed expectations re: course of current medical issues. . Discussed self-management of symptoms. . Outlined signs and symptoms indicating need for more acute intervention. . Patient verbalized understanding and all questions were answered. . See orders for this visit as documented in the electronic medical record. . Patient received an After-Visit Summary.   Jarold Motto, PA-C

## 2018-01-29 ENCOUNTER — Encounter: Payer: Self-pay | Admitting: Family Medicine

## 2018-02-03 MED FILL — TRANEXAMIC ACID 650 MG TAB: 650 | 5 days supply | Qty: 30 | Fill #5

## 2018-02-03 MED FILL — LEVOTHYROXINE 150 MCG TAB: 150 | 90 days supply | Qty: 90 | Fill #3

## 2018-03-26 MED FILL — TRANEXAMIC ACID 650 MG TAB: 650 | 5 days supply | Qty: 30 | Fill #0

## 2018-04-09 MED FILL — TRANEXAMIC ACID 650 MG TAB: 650 | 84 days supply | Qty: 90 | Fill #0

## 2018-04-12 NOTE — Progress Notes (Signed)
Shannon BevelsJennifer M Wagner is a 42 y.o. female is here for follow up.  History of Present Illness:   HPI: Shannon Wagner is a 42 y.o. female  is seen in FOLLOW-UP for hypothyroidism. Current symptoms: none. Patient denies change in energy level, diarrhea, heat / cold intolerance, nervousness, palpitations and weight changes.   Working on [employer]. Interested in working with Raynelle FanningJulie Dillion if possible.  Wants to avoid weight check.   Health Maintenance Due  Topic Date Due  . INFLUENZA VACCINE  04/09/2018   [facility] 04/13/2018 04/10/2017 01/01/2016  Decreased Interest 0 0 0  Down, Depressed, Hopeless 0 0 0  PHQ - 2 Score 0 0 0   PMHx, SurgHx, SocialHx, FamHx, Medications, and Allergies were reviewed in the Visit Navigator and updated as appropriate.   Patient Active Problem List   Diagnosis Date Noted  . Acquired hypothyroidism 04/10/2017  . Obesity (BMI 30-39.9) 04/10/2017   Social History   Tobacco Use  . Smoking status: Never Smoker  . Smokeless tobacco: Never Used  Substance Use Topics  . Alcohol use: Yes    Comment: social   . Drug use: No   Current Medications and Allergies:   .  Cholecalciferol (VITAMIN D) 2000 UNITS tablet, Take 2,000 Units by mouth daily., Disp: , Rfl:  .  diphenhydrAMINE (BENADRYL) 25 MG tablet, Take 25 mg by mouth every 6 (six) hours as needed for sleep., Disp: , Rfl:  .  ibuprofen (ADVIL,MOTRIN) 600 MG tablet, Take 1 tablet (600 mg total) by mouth every 6 (six) hours. (Patient taking differently: Take 600 mg by mouth every 6 (six) hours as needed. ), Disp: 30 tablet, Rfl: 0 .  levothyroxine (SYNTHROID, LEVOTHROID) 150 MCG tablet, Take 1 tablet (150 mcg total) by mouth daily., Disp: 90 tablet, Rfl: 3 .  Multiple Vitamin (MULTI VITAMIN PO), Take by mouth., Disp: , Rfl:  .  tranexamic acid (LYSTEDA) 650 MG TABS tablet, TAKE 2 TABLETS BY MOUTH THREE TIMES DAILY ON DAYS 1 5 OF CYCLE, Disp: , Rfl: 5  No Known Allergies   Review of Systems    Pertinent items are noted in the HPI. Otherwise, ROS is negative.  Vitals:   Vitals:   04/13/18 0954  BP: 114/76  Pulse: 65  Temp: 98 F (36.7 C)  TempSrc: Oral  SpO2: 98%  Height: 5\' 4"  (1.626 m)     Body mass index is 32.44 kg/m.  Physical Exam:   Physical Exam  Constitutional: She appears well-nourished.  HENT:  Head: Normocephalic and atraumatic.  Eyes: Pupils are equal, round, and reactive to light. EOM are normal.  Neck: Normal range of motion. Neck supple.  Cardiovascular: Normal rate, regular rhythm, normal heart sounds and intact distal pulses.  Pulmonary/Chest: Effort normal.  Abdominal: Soft.  Skin: Skin is warm.  Psychiatric: She has a normal mood and affect. Her behavior is normal.  Nursing note and vitals reviewed.  Assessment and Plan:   Victorino DikeJennifer was seen today for follow-up.  Diagnoses and all orders for this visit:  Acquired hypothyroidism -     T4, free -     TSH  Obesity (BMI 30-39.9) -     T4, free -     TSH -     Amb Referral to Nutrition and Diabetic E   . Reviewed expectations re: course of current medical issues. . Discussed self-management of symptoms. . Outlined signs and symptoms indicating need for more acute intervention. . Patient verbalized understanding  and all questions were answered. Marland Kitchen Health Maintenance issues including appropriate healthy diet, exercise, and smoking avoidance were discussed with patient. . See orders for this visit as documented in the electronic medical record. . Patient received an After Visit Summary.  Helane Rima, DO [place] 04/14/2018  Future Appointments  Date Time Provider Department Center  10/23/2018 10:00 AM Helane Rima, DO [facility]

## 2018-04-13 ENCOUNTER — Encounter: Payer: Self-pay | Admitting: Family Medicine

## 2018-04-13 ENCOUNTER — Ambulatory Visit (INDEPENDENT_AMBULATORY_CARE_PROVIDER_SITE_OTHER): Payer: No Typology Code available for payment source | Admitting: Family Medicine

## 2018-04-13 VITALS — BP 114/76 | HR 65 | Temp 98.0°F | Ht 64.0 in

## 2018-04-13 DIAGNOSIS — E669 Obesity, unspecified: Secondary | ICD-10-CM | POA: Diagnosis not present

## 2018-04-13 DIAGNOSIS — E039 Hypothyroidism, unspecified: Secondary | ICD-10-CM

## 2018-04-13 LAB — T4, FREE: Free T4: 0.91 ng/dL (ref 0.60–1.60)

## 2018-04-13 LAB — TSH: TSH: 7.14 u[IU]/mL — ABNORMAL HIGH (ref 0.35–4.50)

## 2018-04-14 ENCOUNTER — Encounter: Payer: Self-pay | Admitting: Family Medicine

## 2018-05-13 ENCOUNTER — Other Ambulatory Visit: Payer: Self-pay | Admitting: Family Medicine

## 2018-05-13 DIAGNOSIS — E039 Hypothyroidism, unspecified: Secondary | ICD-10-CM

## 2018-05-13 NOTE — Telephone Encounter (Signed)
Please advise on refill. In your last lab message you said something about increase dose.

## 2018-05-14 MED FILL — LEVOTHYROXINE 150 MCG TAB: 150 | 90 days supply | Qty: 90 | Fill #0

## 2018-07-21 MED FILL — TRANEXAMIC ACID 650 MG TAB: 650 | 84 days supply | Qty: 90 | Fill #1

## 2018-07-31 ENCOUNTER — Encounter: Payer: Self-pay | Admitting: Family Medicine

## 2018-08-04 ENCOUNTER — Other Ambulatory Visit: Payer: Self-pay

## 2018-08-04 DIAGNOSIS — E669 Obesity, unspecified: Secondary | ICD-10-CM

## 2018-08-04 NOTE — Addendum Note (Signed)
Addended by: Donnamarie PoagHOMPSON, JOELLEN Y on: 08/04/2018 08:10 AM   Modules accepted: Orders

## 2018-08-17 ENCOUNTER — Telehealth: Payer: No Typology Code available for payment source | Admitting: Family

## 2018-08-17 DIAGNOSIS — L089 Local infection of the skin and subcutaneous tissue, unspecified: Secondary | ICD-10-CM

## 2018-08-17 DIAGNOSIS — B9689 Other specified bacterial agents as the cause of diseases classified elsewhere: Secondary | ICD-10-CM | POA: Diagnosis not present

## 2018-08-17 MED ORDER — MUPIROCIN 2 % EX OINT: 1.0000 "application " | TOPICAL_OINTMENT | Freq: Two times a day (BID) | CUTANEOUS | 0 refills | Status: DC

## 2018-08-17 NOTE — Progress Notes (Signed)
E Visit for Rash  We are sorry that you are not feeling well. Here is how we plan to help!    Based on what you submitted, it seems like you have an infected skin area. I would recommend you continue with antibiotic ointment. I have sent in Topical mupiricin to your pharmacy.     HOME CARE:   Take cool showers and avoid direct sunlight.  Apply cool compress or wet dressings.  Take a bath in an oatmeal bath.  Sprinkle content of one Aveeno packet under running faucet with comfortably warm water.  Bathe for 15-20 minutes, 1-2 times daily.  Pat dry with a towel. Do not rub the rash.  Use hydrocortisone cream.  Take an antihistamine like Benadryl for widespread rashes that itch.  The adult dose of Benadryl is 25-50 mg by mouth 4 times daily.  Caution:  This type of medication may cause sleepiness.  Do not drink alcohol, drive, or operate dangerous machinery while taking antihistamines.  Do not take these medications if you have prostate enlargement.  Read package instructions thoroughly on all medications that you take.  GET HELP RIGHT AWAY IF:   Symptoms don't go away after treatment.  Severe itching that persists.  If you rash spreads or swells.  If you rash begins to smell.  If it blisters and opens or develops a yellow-brown crust.  You develop a fever.  You have a sore throat.  You become short of breath.  MAKE SURE YOU:  Understand these instructions. Will watch your condition. Will get help right away if you are not doing well or get worse.  Thank you for choosing an e-visit. Your e-visit answers were reviewed by a board certified advanced clinical practitioner to complete your personal care plan. Depending upon the condition, your plan could have included both over the counter or prescription medications. Please review your pharmacy choice. Be sure that the pharmacy you have chosen is open so that you can pick up your prescription now.  If there is a problem  you may message your provider in MyChart to have the prescription routed to another pharmacy. Your safety is important to us. If you have drug allergies check your prescription carefully.  For the next 24 hours, you can use MyChart to ask questions about today's visit, request a non-urgent call back, or ask for a work or school excuse from your e-visit provider. You will get an email in the next two days asking about your experience. I hope that your e-visit has been valuable and will speed your recovery.

## 2018-08-28 MED FILL — LEVOTHYROXINE 150 MCG TAB: 150 | 90 days supply | Qty: 90 | Fill #1

## 2018-10-09 ENCOUNTER — Telehealth: Payer: Self-pay

## 2018-10-09 NOTE — Telephone Encounter (Signed)
Copied from CRM [id number]. Topic: General - Inquiry >> Oct 09, 2018 10:08 AM Mickel Baas B, NT wrote: Reason for CRM: patient calling and would like to know if she needs to fast for her appt on 11/06/2018? Please advise.

## 2018-10-16 NOTE — Telephone Encounter (Signed)
Called patient let her know we are not able to do labs before visit. She has UMR and has to be associated with visit or it will not be covered.

## 2018-10-23 ENCOUNTER — Ambulatory Visit: Payer: No Typology Code available for payment source | Admitting: Family Medicine

## 2018-11-05 NOTE — Progress Notes (Signed)
Subjective:    Shannon Wagner is a 43 y.o. female and is here for a comprehensive physical exam.  There are no preventive care reminders to display for this patient.  .  Cholecalciferol (VITAMIN D) 2000 UNITS tablet, Take 2,000 Units by mouth daily., Disp: , Rfl:  .  diphenhydrAMINE (BENADRYL) 25 MG tablet, Take 25 mg by mouth every 6 (six) hours as needed for sleep., Disp: , Rfl:  .  ibuprofen (ADVIL,MOTRIN) 600 MG tablet, Take 1 tablet (600 mg total) by mouth every 6 (six) hours. (Patient not taking: Reported on 11/06/2018), Disp: 30 tablet, Rfl: 0 .  levothyroxine (SYNTHROID, LEVOTHROID) 150 MCG tablet, TAKE 1 TABLET (150 MCG TOTAL) BY MOUTH DAILY. (Patient not taking: Reported on 11/06/2018), Disp: 90 tablet, Rfl: 3 .  Multiple Vitamin (MULTI VITAMIN PO), Take by mouth., Disp: , Rfl:  .  tranexamic acid (LYSTEDA) 650 MG TABS tablet, TAKE 2 TABLETS BY MOUTH THREE TIMES DAILY ON DAYS 1 5 OF CYCLE, Disp: , Rfl: 5  PMHx, SurgHx, SocialHx, Medications, and Allergies were reviewed in the Visit Navigator and updated as appropriate.   Past Medical History:  Diagnosis Date  . Acquired hypothyroidism 04/10/2017  . Obesity (BMI 30-39.9) 04/10/2017    History reviewed. No pertinent surgical history.   Family History  Problem Relation Age of Onset  . Cancer Mother   . Diabetes Father   . Heart disease Father   . Cancer Maternal Grandmother   . Heart disease Paternal Grandmother   . Heart disease Paternal Grandfather     Social History   Tobacco Use  . Smoking status: Never Smoker  . Smokeless tobacco: Never Used  Substance Use Topics  . Alcohol use: Yes    Comment: social   . Drug use: No    Review of Systems:   Pertinent items are noted in the HPI. Otherwise, ROS is negative.  Objective:   BP 120/78   Pulse 70   Temp 98.6 F (37 C) (Oral)   Ht 5\' 4"  (1.626 m)   SpO2 98%   BMI 32.44 kg/m   General appearance: alert, cooperative and appears stated age. Head:  normocephalic, without obvious abnormality, atraumatic. Neck: no adenopathy, supple, symmetrical, trachea midline; thyroid not enlarged, symmetric, no tenderness/mass/nodules. Lungs: clear to auscultation bilaterally. Heart: regular rate and rhythm Abdomen: soft, non-tender; no masses,  no organomegaly. Extremities: extremities normal, atraumatic, no cyanosis or edema. Skin: skin color, texture, turgor normal, no rashes or lesions. Lymph: cervical, supraclavicular, and axillary nodes normal; no abnormal inguinal nodes palpated. Neurologic: grossly normal.                                      Assessment/Plan:   [name] was seen today for follow-up.  Diagnoses and all orders for this visit:  Routine physical examination  Grief reaction Comments: Aunt died last week. Funeral tonight.  Orders: -     ALPRAZolam (XANAX) 0.5 MG tablet; Take 1 tablet (0.5 mg total) by mouth at bedtime as needed for anxiety. -     Ambulatory referral to Psychology  Acquired hypothyroidism Comments: Stopped taking meds for a few days with everything going on. Will restart today and check labs in a few weeks.  Orders: -     TSH; Future -     T4, free; Future  Obesity (BMI 30-39.9)  Family history of early CAD -  EKG 12-Lead; Future -     CT CARDIAC SCORING; Future  Family history of long QT syndrome -     EKG 12-Lead; Future -     CT CARDIAC SCORING; Future  Malaise and fatigue -     CBC with Differential/Platelet; Future -     Comprehensive metabolic panel; Future -     EKG 12-Lead; Future  Screening for lipid disorders -     Lipid panel; Future   Patient Counseling: [x]    Nutrition: Stressed importance of moderation in sodium/caffeine intake, saturated fat and cholesterol, caloric balance, sufficient intake of fresh fruits, vegetables, fiber, calcium, iron, and 1 mg of folate supplement per day (for females capable of pregnancy).  [x]    Stressed the importance of regular exercise.     [x]    Substance Abuse: Discussed cessation/primary prevention of tobacco, alcohol, or other drug use; driving or other dangerous activities under the influence; availability of treatment for abuse.   [x]    Injury prevention: Discussed safety belts, safety helmets, smoke detector, smoking near bedding or upholstery.   [x]    Sexuality: Discussed sexually transmitted diseases, partner selection, use of condoms, avoidance of unintended pregnancy  and contraceptive alternatives.  [x]    Dental health: Discussed importance of regular tooth brushing, flossing, and dental visits.  [x]    Health maintenance and immunizations reviewed. Please refer to Health maintenance section.   Helane Rima, DO [facility]

## 2018-11-06 ENCOUNTER — Encounter: Payer: Self-pay | Admitting: Family Medicine

## 2018-11-06 ENCOUNTER — Ambulatory Visit (INDEPENDENT_AMBULATORY_CARE_PROVIDER_SITE_OTHER): Payer: No Typology Code available for payment source | Admitting: Family Medicine

## 2018-11-06 VITALS — BP 120/78 | HR 70 | Temp 98.6°F | Ht 64.0 in

## 2018-11-06 DIAGNOSIS — Z0000 Encounter for general adult medical examination without abnormal findings: Secondary | ICD-10-CM

## 2018-11-06 DIAGNOSIS — E039 Hypothyroidism, unspecified: Secondary | ICD-10-CM

## 2018-11-06 DIAGNOSIS — Z13220 Encounter for screening for lipoid disorders: Secondary | ICD-10-CM

## 2018-11-06 DIAGNOSIS — F4321 Adjustment disorder with depressed mood: Secondary | ICD-10-CM | POA: Diagnosis not present

## 2018-11-06 DIAGNOSIS — Z8249 Family history of ischemic heart disease and other diseases of the circulatory system: Secondary | ICD-10-CM | POA: Insufficient documentation

## 2018-11-06 DIAGNOSIS — E669 Obesity, unspecified: Secondary | ICD-10-CM | POA: Diagnosis not present

## 2018-11-06 DIAGNOSIS — R5381 Other malaise: Secondary | ICD-10-CM

## 2018-11-06 DIAGNOSIS — R5383 Other fatigue: Secondary | ICD-10-CM

## 2018-11-06 MED ORDER — ALPRAZOLAM 0.5 MG PO TABS: 0.5000 mg | ORAL_TABLET | Freq: Every evening | ORAL | 0 refills | Status: DC | PRN

## 2018-11-06 MED FILL — ALPRAZolam 0.5 MG TABS: 0.5 | 20 days supply | Qty: 20 | Fill #0

## 2018-11-23 MED FILL — TRANEXAMIC ACID 650 MG TAB: 650 | 90 days supply | Qty: 90 | Fill #2

## 2018-11-25 ENCOUNTER — Telehealth: Payer: Self-pay

## 2018-11-25 NOTE — Telephone Encounter (Signed)
-----   Message from Helane Rima, DO sent at 11/07/2018  4:53 PM EST ----- Please call the patient to let her know that I forgot to obtain an EKG before she left.  She will be coming in for future labs and I would like an EKG completed at that time.  Diagnosis for EKG is family history early cardiac death and long QT syndrome.

## 2018-12-03 ENCOUNTER — Inpatient Hospital Stay: Admission: RE | Admit: 2018-12-03 | Payer: No Typology Code available for payment source | Source: Ambulatory Visit

## 2018-12-04 ENCOUNTER — Ambulatory Visit: Payer: No Typology Code available for payment source | Admitting: Psychology

## 2018-12-04 MED FILL — LEVOTHYROXINE 150 MCG TAB: 150 | 90 days supply | Qty: 90 | Fill #0

## 2018-12-22 ENCOUNTER — Inpatient Hospital Stay: Admission: RE | Admit: 2018-12-22 | Payer: No Typology Code available for payment source | Source: Ambulatory Visit

## 2019-02-16 ENCOUNTER — Inpatient Hospital Stay: Admission: RE | Admit: 2019-02-16 | Payer: No Typology Code available for payment source | Source: Ambulatory Visit

## 2019-02-26 MED FILL — LEVOTHYROXINE 150 MCG TAB: 150 | 90 days supply | Qty: 90 | Fill #1

## 2019-03-23 MED FILL — TRANEXAMIC ACID 650 MG TAB: 650 | 90 days supply | Qty: 90 | Fill #3

## 2019-04-02 ENCOUNTER — Telehealth: Payer: No Typology Code available for payment source | Admitting: Family

## 2019-04-02 DIAGNOSIS — R21 Rash and other nonspecific skin eruption: Secondary | ICD-10-CM

## 2019-04-02 MED ORDER — PREDNISONE 10 MG (21) PO TBPK: ORAL_TABLET | ORAL | 0 refills | Status: DC

## 2019-04-02 NOTE — Progress Notes (Signed)
For the safety of you and your child, I recommend a face to face office visit with a health care provider.  Many mothers need to take medicines during their pregnancy and while nursing.  Almost all medicines pass into the breast milk in small quantities.  Most are generally considered safe for a mother to take but some medicines must be avoided.  After reviewing your E-Visit request, I recommend that you consult your OB/GYN or pediatrician for medical advice in relation to your condition and prescription medications while pregnant or breastfeeding.    NOTE: If you entered your credit card information for this eVisit, you will not be charged. You may see a "hold" on your card for the $35 but that hold will drop off and you will not have a charge processed.  If you are having a true medical emergency please call 911.  If you need an urgent face to face visit, [facility] has four urgent care centers for your convenience.  If you need care fast and have a high deductible or no insurance consider:   [email or web address].[email or web address] to reserve your spot online an avoid wait times  [facility] [address], [address] [place], [place] [address] Modified hours of operation: Monday-Friday, 12 PM to 6 PM  Saturday & Sunday 10 AM to 4 PM  [facility] (New Address!) [address], [address] [place], [place] [address] *Just off [address], across the road from [employer]* Modified hours of operation: Monday-Friday, 12 PM to 6 PM  Closed Saturday & Sunday  [facility]'s modified hours of operation will be in effect from May 1 until May 31  The following sites will take your  insurance:  . [facility]  [phone] Get Driving Directions Find a Provider at this Location  [address] [place], [place] [address] . 10 am to 8 pm Monday-Friday . 12 pm to 8 pm Saturday-Sunday   . [facility]   [phone] Get Driving Directions Find a Provider at this Location  [address], [address] [place], [place] [address] . 8 am to 8 pm Monday-Friday . 9 am to 6 pm Saturday . 11 am to 6 pm Sunday   . [facility] [place]  [phone] Get Driving Directions  [address] [place],  [address] . 8 am to 8 pm Monday-Friday . 8 am to 4 pm Saturday-Sunday   Your e-visit answers were reviewed by a board certified advanced clinical practitioner to complete your personal care plan.  Thank you for using e-Visits.  

## 2019-04-13 LAB — HM MAMMOGRAPHY

## 2019-05-27 ENCOUNTER — Other Ambulatory Visit: Payer: Self-pay | Admitting: Family Medicine

## 2019-05-27 DIAGNOSIS — E039 Hypothyroidism, unspecified: Secondary | ICD-10-CM

## 2019-05-31 MED FILL — LEVOTHYROXINE 150 MCG TAB: 150 | 90 days supply | Qty: 90 | Fill #0

## 2019-06-02 ENCOUNTER — Encounter: Payer: Self-pay | Admitting: Family Medicine

## 2019-06-30 MED FILL — TRANEXAMIC ACID 650 MG TAB: 650 | 84 days supply | Qty: 90 | Fill #0

## 2019-07-20 MED FILL — LEVOTHYROXINE 150 MCG TAB: 150 | 90 days supply | Qty: 90 | Fill #0

## 2019-09-27 ENCOUNTER — Other Ambulatory Visit: Payer: Self-pay

## 2019-09-28 ENCOUNTER — Other Ambulatory Visit: Payer: Self-pay | Admitting: Physician Assistant

## 2019-09-28 ENCOUNTER — Ambulatory Visit (INDEPENDENT_AMBULATORY_CARE_PROVIDER_SITE_OTHER): Payer: No Typology Code available for payment source | Admitting: Physician Assistant

## 2019-09-28 ENCOUNTER — Encounter: Payer: Self-pay | Admitting: Physician Assistant

## 2019-09-28 VITALS — BP 120/84 | HR 83 | Temp 98.0°F | Ht 64.0 in

## 2019-09-28 DIAGNOSIS — E559 Vitamin D deficiency, unspecified: Secondary | ICD-10-CM

## 2019-09-28 DIAGNOSIS — E039 Hypothyroidism, unspecified: Secondary | ICD-10-CM

## 2019-09-28 DIAGNOSIS — F419 Anxiety disorder, unspecified: Secondary | ICD-10-CM | POA: Diagnosis not present

## 2019-09-28 LAB — TSH: TSH: 32.5 u[IU]/mL — ABNORMAL HIGH (ref 0.35–4.50)

## 2019-09-28 LAB — VITAMIN D 25 HYDROXY (VIT D DEFICIENCY, FRACTURES): VITD: 29.04 ng/mL — ABNORMAL LOW (ref 30.00–100.00)

## 2019-09-28 MED ORDER — ALPRAZOLAM 0.5 MG PO TABS: 0.5000 mg | ORAL_TABLET | Freq: Every evening | ORAL | 0 refills | Status: DC | PRN

## 2019-09-28 MED ORDER — LEVOTHYROXINE SODIUM 175 MCG PO TABS: 175.0000 ug | ORAL_TABLET | Freq: Every day | ORAL | 1 refills | Status: DC

## 2019-09-28 MED FILL — ALPRAZolam 0.5 MG TABS: 0.5 | 20 days supply | Qty: 20 | Fill #0

## 2019-09-28 MED FILL — LEVOTHYROXINE 175 MCG TABLE: 175 | 30 days supply | Qty: 30 | Fill #0

## 2019-09-28 NOTE — Patient Instructions (Signed)
It was great to see you!  I will be in touch via MyChart with lab results. I plan to fill your thyroid medication after your thyroid lab has resulted.  Let's follow-up after March 1 for a physical, sooner if you have concerns.  Take care,  Jarold Motto PA-C

## 2019-09-28 NOTE — Progress Notes (Signed)
Shannon Wagner is a 44 y.o. female is here for transfer of care.  I acted as a Neurosurgeon for [employer], PA-C Shannon Mull, LPN  History of Present Illness:   Chief Complaint  Patient presents with  . Transfer of care  . Hypothyroidism    HPI   Pt here for transfer of care from Dr. Earlene Wagner.  Hypothyroidism Diagnosed about 13 years ago. Pt following up today, currently taking Levothyroxine 150 mcg daily. Compliant with medication. Last TSH was >1 year ago, Aug 2019, and was 7.14. Feels like her thyroid level is probably under good control.   Vit D Has been doing the 5000 UT dose for a few months now. Would like Vit D updated.  Anxiety Rarely takes xanax. Needs to take at times when she feels like she can't shut her mind down. Denies SI/HI, or concerns for depression.   There are no preventive care reminders to display for this patient.  Past Medical History:  Diagnosis Date  . Acquired hypothyroidism 04/10/2017  . Obesity (BMI 30-39.9) 04/10/2017     Social History   Socioeconomic History  . Marital status: Married    Spouse name: Not on file  . Number of children: Not on file  . Years of education: Not on file  . Highest education level: Not on file  Occupational History  . Not on file  Tobacco Use  . Smoking status: Never Smoker  . Smokeless tobacco: Never Used  Substance and Sexual Activity  . Alcohol use: Yes    Comment: social   . Drug use: No  . Sexual activity: Yes  Other Topics Concern  . 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 Difficulty of Paying Living Expenses: Not on file  Food Insecurity:   . Worried About Programme researcher, broadcasting/film/video in the Last Year: Not on file  . Ran Out of Food in the Last Year: Not on file  Transportation Needs:   . Lack of Transportation (Medical): Not on file  . Lack of Transportation (Non-Medical): Not on file    Physical Activity:   . Days of Exercise per Week: Not on file  . Minutes of Exercise per Session: Not on file  Stress:   . Feeling of Stress : Not on file  Social Connections:   . Frequency of Communication with Friends and Family: Not on file  . Frequency of Social Gatherings with Friends and Family: Not on file  . Attends Religious Services: Not on file  . Active Member of Clubs or Organizations: Not on file  . Attends Banker Meetings: Not on file  . Marital Status: Not on file  Intimate Partner Violence:   . Fear of Current or Ex-Partner: Not on file  . Emotionally Abused: Not on file  . Physically Abused: Not on file  . Sexually Abused: Not on file    History reviewed. No pertinent surgical history.  Family History  Problem Relation Age of Onset  . Cancer Mother   . Diabetes Father   . Heart disease Father   . Cancer Maternal Grandmother   . Heart disease Paternal Grandmother   . Heart disease Paternal Grandfather     PMHx, SurgHx, SocialHx, FamHx, Medications, and Allergies were reviewed in the Visit Navigator and updated as appropriate.   Patient Active Problem List  Diagnosis Date Noted  . Family history of long QT syndrome 11/06/2018  . Family history of early CAD 11/06/2018  . Acquired hypothyroidism 04/10/2017  . Obesity (BMI 30-39.9) 04/10/2017    Social History   Tobacco Use  . Smoking status: Never Smoker  . Smokeless tobacco: Never Used  Substance Use Topics  . Alcohol use: Yes    Comment: social   . Drug use: No    Current Medications and Allergies:    Current Outpatient Medications:  .  ALPRAZolam (XANAX) 0.5 MG tablet, Take 1 tablet (0.5 mg total) by mouth at bedtime as needed for anxiety., Disp: 20 tablet, Rfl: 0 .  buPROPion (WELLBUTRIN SR) 150 MG 12 hr tablet, Take 150 mg by mouth 2 (two) times daily., Disp: , Rfl:  .  cetirizine (ZYRTEC) 5 MG tablet, Take 5 mg by mouth daily., Disp: , Rfl:  .  Cholecalciferol (VITAMIN  D3) 125 MCG (5000 UT) CAPS, Take 1 capsule by mouth daily., Disp: , Rfl:  .  diphenhydrAMINE (BENADRYL) 25 MG tablet, Take 25 mg by mouth every 6 (six) hours as needed for sleep., Disp: , Rfl:  .  ibuprofen (ADVIL,MOTRIN) 600 MG tablet, Take 1 tablet (600 mg total) by mouth every 6 (six) hours., Disp: 30 tablet, Rfl: 0 .  levothyroxine (SYNTHROID) 150 MCG tablet, TAKE 1 TABLET (150 MCG TOTAL) BY MOUTH DAILY., Disp: 90 tablet, Rfl: 0 .  Multiple Vitamin (MULTI VITAMIN PO), Take by mouth., Disp: , Rfl:  .  naltrexone (DEPADE) 50 MG tablet, Take 25 mg by mouth 2 (two) times daily., Disp: , Rfl:  .  tranexamic acid (LYSTEDA) 650 MG TABS tablet, TAKE 2 TABLETS BY MOUTH THREE TIMES DAILY ON DAYS 1 5 OF CYCLE, Disp: , Rfl: 5  No Known Allergies  Review of Systems   ROS Negative unless otherwise specified per HPI.  Vitals:   Vitals:   09/28/19 1050  BP: 120/84  Pulse: 83  Temp: 98 F (36.7 C)  TempSrc: Temporal  SpO2: 97%  Height: 5\' 4"  (1.626 m)     Body mass index is 32.44 kg/m.   Physical Exam:    Physical Exam Vitals and nursing note reviewed.  Constitutional:      General: She is not in acute distress.    Appearance: She is well-developed. She is not ill-appearing or toxic-appearing.  Cardiovascular:     Rate and Rhythm: Normal rate and regular rhythm.     Pulses: Normal pulses.     Heart sounds: Normal heart sounds, S1 normal and S2 normal.     Comments: No LE edema Pulmonary:     Effort: Pulmonary effort is normal.     Breath sounds: Normal breath sounds.  Skin:    General: Skin is warm and dry.  Neurological:     Mental Status: She is alert.     GCS: GCS eye subscore is 4. GCS verbal subscore is 5. GCS motor subscore is 6.  Psychiatric:        Speech: Speech normal.        Behavior: Behavior normal. Behavior is cooperative.      Assessment and Plan:    Shannon Wagner was seen today for transfer of care and hypothyroidism.  Diagnoses and all orders for this  visit:  Acquired hypothyroidism Update labs and refill prescription based upon lab results. -     TSH  Anxiety Uses xanax rarely. [place] Controlled Substance Database reviewed today regarding patient. Patient is compliant with [place] regarding  pharmacy use and one-prescribing provider. It is appropriate to continue current medication regimen.  Vitamin D deficiency -     VITAMIN D 25 Hydroxy (Vit-D Deficiency, Fractures)  . Reviewed expectations re: course of current medical issues. . Discussed self-management of symptoms. . Outlined signs and symptoms indicating need for more acute intervention. . Patient verbalized understanding and all questions were answered. . See orders for this visit as documented in the electronic medical record. . Patient received an After Visit Summary.  CMA or LPN served as scribe during this visit. History, Physical, and Plan performed by medical provider. The above documentation has been reviewed and is accurate and complete.  Jarold Motto, PA-C [facility] 09/28/2019  Follow-up: Return in about 6 weeks (around 11/08/2019) for CPE.

## 2019-10-25 MED FILL — LEVOTHYROXINE 175 MCG TABLE: 175 | 30 days supply | Qty: 30 | Fill #1

## 2019-11-08 ENCOUNTER — Encounter: Payer: Self-pay | Admitting: Physician Assistant

## 2019-11-08 NOTE — Progress Notes (Signed)
I acted as a Neurosurgeon for [employer], PA-C Shannon Mull, LPN   Subjective:    Shannon Wagner is a 44 y.o. female and is here for a comprehensive physical exam.   HPI  Health Maintenance Due  Topic Date Due  . PAP SMEAR-Modifier  01/03/2020    Acute Concerns: None  Chronic Issues: Allergies -- currently on zyrtec 5 mg q other day; is trying to getting off it completely. Has tried nasal sprays in the past. She has been reading online that zyrtec can cause some rebound symptoms when trying to come off of it. She has been using allegra prn to bridge herself off of it. Hypothyroidism -- recently increased to 175 mcg daily. Due for recheck today. She is taking her medication appropriately; denies any concerns for lack of control. Vit D deficiency -- most recent labs checked on 09/28/19 and were essentially normal  Health Maintenance: Immunizations -- UTD Colonoscopy -- N/A Mammogram -- UTD, due 04/2020 PAP -- due 12/2019, sees GYN Bone Density -- N/A Diet -- overall well balanced Caffeine intake -- no excessive Sleep habits -- no concerns Exercise -- as able Weight --    refused Mood -- overall okay Weight history: Wt Readings from Last 10 Encounters:  12/25/17 189 lb (85.7 kg)  10/13/17 186 lb 3.2 oz (84.5 kg)  04/10/17 183 lb (83 kg)  01/01/16 204 lb (92.5 kg)  10/21/15 212 lb 9.6 oz (96.4 kg)  06/30/15 209 lb (94.8 kg)  02/21/14 235 lb (106.6 kg)   Patient's last menstrual period was 10/28/2019. Alcohol use: no concerning  Tobacco use: none  Depression screen [facility] 2/9 11/09/2019  Decreased Interest 0  Down, Depressed, Hopeless 0  PHQ - 2 Score 0  Altered sleeping 1  Tired, decreased energy 1  Change in appetite 1  Feeling bad or failure about yourself  0  Trouble concentrating 0  Moving slowly or fidgety/restless 0  Suicidal thoughts 0  PHQ-9 Score 3  Difficult doing work/chores Not difficult at all     Other providers/specialists: Patient Care  Team: Shannon Wagner, [place] as PCP - General (Physician Assistant)    PMHx, SurgHx, SocialHx, Medications, and Allergies were reviewed in the Visit Navigator and updated as appropriate.   Past Medical History:  Diagnosis Date  . Acquired hypothyroidism 04/10/2017  . Obesity (BMI 30-39.9) 04/10/2017    History reviewed. No pertinent surgical history.   Family History  Problem Relation Age of Onset  . Cancer Mother   . Diabetes Father   . Heart disease Father   . Cancer Maternal Grandmother   . Heart disease Paternal Grandmother   . Heart disease Paternal Grandfather     Social History   Tobacco Use  . Smoking status: Never Smoker  . Smokeless tobacco: Never Used  Substance Use Topics  . Alcohol use: Yes    Comment: social   . Drug use: No    Review of Systems:   Review of Systems  Constitutional: Negative for chills, fever, malaise/fatigue and weight loss.  HENT: Negative for hearing loss, sinus pain and sore throat.   Eyes: Negative for blurred vision.  Respiratory: Negative for cough and shortness of breath.   Cardiovascular: Negative for chest pain, palpitations and leg swelling.  Gastrointestinal: Negative for abdominal pain, constipation, diarrhea, heartburn, nausea and vomiting.  Genitourinary: Negative for dysuria, frequency and urgency.  Musculoskeletal: Negative for back pain, myalgias and neck pain.  Skin: Negative for itching and rash.  Neurological: Negative  for dizziness, tingling, seizures, loss of consciousness and headaches.  Endo/Heme/Allergies: Negative for polydipsia.  Psychiatric/Behavioral: Negative for depression. The patient is not nervous/anxious.   All other systems reviewed and are negative.   Objective:   BP 118/80 (BP Location: Left Arm, Patient Position: Sitting, Cuff Size: Large)   Pulse 62   Temp 97.6 F (36.4 C) (Temporal)   Ht 5\' 4"  (1.626 m)   LMP 10/28/2019   SpO2 98%   BMI 32.44 kg/m  Body mass index is 32.44  kg/m.   General Appearance:    Alert, cooperative, no distress, appears stated age  Head:    Normocephalic, without obvious abnormality, atraumatic  Eyes:    PERRL, conjunctiva/corneas clear, EOM's intact, fundi    benign, both eyes  Ears:    Normal TM's and external ear canals, both ears  Nose:   Nares normal, septum midline, mucosa normal, no drainage    or sinus tenderness  Throat:   Lips, mucosa, and tongue normal; teeth and gums normal  Neck:   Supple, symmetrical, trachea midline, no adenopathy;    thyroid:  no enlargement/tenderness/nodules; no carotid   bruit or JVD  Back:     Symmetric, no curvature, ROM normal, no CVA tenderness  Lungs:     Clear to auscultation bilaterally, respirations unlabored  Chest Wall:    No tenderness or deformity   Heart:    Regular rate and rhythm, S1 and S2 normal, no murmur, rub or gallop  Breast Exam:    Deferred  Abdomen:     Soft, non-tender, bowel sounds active all four quadrants,    no masses, no organomegaly  Genitalia:    Deferred  Extremities:   Extremities normal, atraumatic, no cyanosis or edema  Pulses:   2+ and symmetric all extremities  Skin:   Skin color, texture, turgor normal, no rashes or lesions  Lymph nodes:   Cervical, supraclavicular, and axillary nodes normal  Neurologic:   CNII-XII intact, normal strength, sensation and reflexes    throughout     Assessment/Plan:   [name] was seen today for annual exam.  Diagnoses and all orders for this visit:  Routine physical examination Today patient counseled on age appropriate routine health concerns for screening and prevention, each reviewed and up to date or declined. Immunizations reviewed and up to date or declined. Labs ordered and reviewed. Risk factors for depression reviewed and negative. Hearing function and visual acuity are intact. ADLs screened and addressed as needed. Functional ability and level of safety reviewed and appropriate. Education, counseling and  referrals performed based on assessed risks today. Patient provided with a copy of personalized plan for preventive services.  Acquired hypothyroidism Due for recheck today. Will update labs and adjust levothyroxine as needed.  -     CBC with Differential/Platelet -     Comprehensive metabolic panel -     TSH  Obesity, unspecified classification, unspecified obesity type, unspecified whether serious comorbidity present Encouraged regular exercise and healthy eating.  Encounter for lipid screening for cardiovascular disease -     Lipid panel  Allergies Will trial atrovent nasal spray to see if this can help her continue to wean off of her zyrtec. Consider allergist referral if symptoms are bothersome.  Other orders -     ipratropium (ATROVENT) 0.03 % nasal spray; Place 2 sprays into both nostrils every 12 (twelve) hours.   Well Adult Exam: Labs ordered: Yes. Patient counseling was done. See below for items discussed. Discussed the patient's  BMI.  The BMI is not in the acceptable range; BMI management plan is completed Follow up dependent on lab results. Breast cancer screening: UTD. Cervical cancer screening: UTD   Patient Counseling: [x]    Nutrition: Stressed importance of moderation in sodium/caffeine intake, saturated fat and cholesterol, caloric balance, sufficient intake of fresh fruits, vegetables, fiber, calcium, iron, and 1 mg of folate supplement per day (for females capable of pregnancy).  [x]    Stressed the importance of regular exercise.   [x]    Substance Abuse: Discussed cessation/primary prevention of tobacco, alcohol, or other drug use; driving or other dangerous activities under the influence; availability of treatment for abuse.   [x]    Injury prevention: Discussed safety belts, safety helmets, smoke detector, smoking near bedding or upholstery.   [x]    Sexuality: Discussed sexually transmitted diseases, partner selection, use of condoms, avoidance of unintended pregnancy   and contraceptive alternatives.  [x]    Dental health: Discussed importance of regular tooth brushing, flossing, and dental visits.  [x]    Health maintenance and immunizations reviewed. Please refer to Health maintenance section.   CMA or LPN served as scribe during this visit. History, Physical, and Plan performed by medical provider. The above documentation has been reviewed and is accurate and complete.   Inda Coke, PA-C [place]

## 2019-11-09 ENCOUNTER — Encounter: Payer: Self-pay | Admitting: Physician Assistant

## 2019-11-09 ENCOUNTER — Other Ambulatory Visit: Payer: Self-pay | Admitting: Physician Assistant

## 2019-11-09 ENCOUNTER — Ambulatory Visit (INDEPENDENT_AMBULATORY_CARE_PROVIDER_SITE_OTHER): Payer: No Typology Code available for payment source | Admitting: Physician Assistant

## 2019-11-09 ENCOUNTER — Other Ambulatory Visit: Payer: Self-pay

## 2019-11-09 VITALS — BP 118/80 | HR 62 | Temp 97.6°F | Ht 64.0 in

## 2019-11-09 DIAGNOSIS — E039 Hypothyroidism, unspecified: Secondary | ICD-10-CM | POA: Diagnosis not present

## 2019-11-09 DIAGNOSIS — Z136 Encounter for screening for cardiovascular disorders: Secondary | ICD-10-CM

## 2019-11-09 DIAGNOSIS — E669 Obesity, unspecified: Secondary | ICD-10-CM

## 2019-11-09 DIAGNOSIS — Z13220 Encounter for screening for lipoid disorders: Secondary | ICD-10-CM

## 2019-11-09 DIAGNOSIS — Z0000 Encounter for general adult medical examination without abnormal findings: Secondary | ICD-10-CM

## 2019-11-09 DIAGNOSIS — T7840XA Allergy, unspecified, initial encounter: Secondary | ICD-10-CM

## 2019-11-09 LAB — LIPID PANEL
Cholesterol: 167 mg/dL (ref 0–200)
HDL: 58.6 mg/dL (ref >39.00)
LDL Cholesterol: 86 mg/dL (ref 0–99)
NonHDL: 108.06
Total CHOL/HDL Ratio: 3
Triglycerides: 108 mg/dL (ref 0.0–149.0)
VLDL: 21.6 mg/dL (ref 0.0–40.0)

## 2019-11-09 LAB — CBC WITH DIFFERENTIAL/PLATELET
Basophils Absolute: 0.1 10*3/uL (ref 0.0–0.1)
Basophils Relative: 0.8 % (ref 0.0–3.0)
Eosinophils Absolute: 0.1 10*3/uL (ref 0.0–0.7)
Eosinophils Relative: 1.4 % (ref 0.0–5.0)
HCT: 43.4 % (ref 36.0–46.0)
Hemoglobin: 14.8 g/dL (ref 12.0–15.0)
Lymphocytes Relative: 36.1 % (ref 12.0–46.0)
Lymphs Abs: 2.5 10*3/uL (ref 0.7–4.0)
MCHC: 34.1 g/dL (ref 30.0–36.0)
MCV: 93 fl (ref 78.0–100.0)
Monocytes Absolute: 0.4 10*3/uL (ref 0.1–1.0)
Monocytes Relative: 6.2 % (ref 3.0–12.0)
Neutro Abs: 3.9 10*3/uL (ref 1.4–7.7)
Neutrophils Relative %: 55.5 % (ref 43.0–77.0)
Platelets: 297 10*3/uL (ref 150.0–400.0)
RBC: 4.66 Mil/uL (ref 3.87–5.11)
RDW: 13.5 % (ref 11.5–15.5)
WBC: 7 10*3/uL (ref 4.0–10.5)

## 2019-11-09 LAB — COMPREHENSIVE METABOLIC PANEL
ALT: 10 U/L (ref 0–35)
AST: 11 U/L (ref 0–37)
Albumin: 4.2 g/dL (ref 3.5–5.2)
Alkaline Phosphatase: 63 U/L (ref 39–117)
BUN: 9 mg/dL (ref 6–23)
CO2: 25 mEq/L (ref 19–32)
Calcium: 9.6 mg/dL (ref 8.4–10.5)
Chloride: 104 mEq/L (ref 96–112)
Creatinine, Ser: 0.77 mg/dL (ref 0.40–1.20)
GFR: 82.26 mL/min (ref >60.00)
Glucose, Bld: 82 mg/dL (ref 70–99)
Potassium: 4.2 mEq/L (ref 3.5–5.1)
Sodium: 138 mEq/L (ref 135–145)
Total Bilirubin: 0.5 mg/dL (ref 0.2–1.2)
Total Protein: 7 g/dL (ref 6.0–8.3)

## 2019-11-09 LAB — TSH: TSH: 0.78 u[IU]/mL (ref 0.35–4.50)

## 2019-11-09 MED ORDER — LEVOTHYROXINE SODIUM 175 MCG PO TABS: 175.0000 ug | ORAL_TABLET | Freq: Every day | ORAL | 1 refills | Status: DC

## 2019-11-09 MED ORDER — IPRATROPIUM BROMIDE 0.03 % NA SOLN: 2.0000 | Freq: Two times a day (BID) | NASAL | 2 refills | Status: DC

## 2019-11-09 MED FILL — IPRATROPIUM 0.03% SPRAY: 0.03 | 30 days supply | Qty: 30 | Fill #0

## 2019-11-09 NOTE — Patient Instructions (Signed)
It was great to see you!  Please go to the lab for blood work.   Our office will call you with your results unless you have chosen to receive results via MyChart.  If your blood work is normal we will follow-up each year for physicals and as scheduled for chronic medical problems.  If anything is abnormal we will treat accordingly and get you in for a follow-up.  Take care,  Shannon Wagner    Health Maintenance, Female Adopting a healthy lifestyle and getting preventive care are important in promoting health and wellness. Ask your health care provider about:  The right schedule for you to have regular tests and exams.  Things you can do on your own to prevent diseases and keep yourself healthy. What should I know about diet, weight, and exercise? Eat a healthy diet   Eat a diet that includes plenty of vegetables, fruits, low-fat dairy products, and lean protein.  Do not eat a lot of foods that are high in solid fats, added sugars, or sodium. Maintain a healthy weight Body mass index (BMI) is used to identify weight problems. It estimates body fat based on height and weight. Your health care provider can help determine your BMI and help you achieve or maintain a healthy weight. Get regular exercise Get regular exercise. This is one of the most important things you can do for your health. Most adults should:  Exercise for at least 150 minutes each week. The exercise should increase your heart rate and make you sweat (moderate-intensity exercise).  Do strengthening exercises at least twice a week. This is in addition to the moderate-intensity exercise.  Spend less time sitting. Even light physical activity can be beneficial. Watch cholesterol and blood lipids Have your blood tested for lipids and cholesterol at 42 years of age, then have this test every 5 years. Have your cholesterol levels checked more often if:  Your lipid or cholesterol levels are high.  You are older than 42  years of age.  You are at high risk for heart disease. What should I know about cancer screening? Depending on your health history and family history, you may need to have cancer screening at various ages. This may include screening for:  Breast cancer.  Cervical cancer.  Colorectal cancer.  Skin cancer.  Lung cancer. What should I know about heart disease, diabetes, and high blood pressure? Blood pressure and heart disease  High blood pressure causes heart disease and increases the risk of stroke. This is more likely to develop in people who have high blood pressure readings, are of African descent, or are overweight.  Have your blood pressure checked: ? Every 3-5 years if you are 18-39 years of age. ? Every year if you are 40 years old or older. Diabetes Have regular diabetes screenings. This checks your fasting blood sugar level. Have the screening done:  Once every three years after age 40 if you are at a normal weight and have a low risk for diabetes.  More often and at a younger age if you are overweight or have a high risk for diabetes. What should I know about preventing infection? Hepatitis B If you have a higher risk for hepatitis B, you should be screened for this virus. Talk with your health care provider to find out if you are at risk for hepatitis B infection. Hepatitis C Testing is recommended for:  Everyone born from 1945 through 1965.  Anyone with known risk factors for hepatitis C. Sexually   transmitted infections (STIs)  Get screened for STIs, including gonorrhea and chlamydia, if: ? You are sexually active and are younger than 42 years of age. ? You are older than 42 years of age and your health care provider tells you that you are at risk for this type of infection. ? Your sexual activity has changed since you were last screened, and you are at increased risk for chlamydia or gonorrhea. Ask your health care provider if you are at risk.  Ask your health  care provider about whether you are at high risk for HIV. Your health care provider may recommend a prescription medicine to help prevent HIV infection. If you choose to take medicine to prevent HIV, you should first get tested for HIV. You should then be tested every 3 months for as long as you are taking the medicine. Pregnancy  If you are about to stop having your period (premenopausal) and you may become pregnant, seek counseling before you get pregnant.  Take 400 to 800 micrograms (mcg) of folic acid every day if you become pregnant.  Ask for birth control (contraception) if you want to prevent pregnancy. Osteoporosis and menopause Osteoporosis is a disease in which the bones lose minerals and strength with aging. This can result in bone fractures. If you are 65 years old or older, or if you are at risk for osteoporosis and fractures, ask your health care provider if you should:  Be screened for bone loss.  Take a calcium or vitamin D supplement to lower your risk of fractures.  Be given hormone replacement therapy (HRT) to treat symptoms of menopause. Follow these instructions at home: Lifestyle  Do not use any products that contain nicotine or tobacco, such as cigarettes, e-cigarettes, and chewing tobacco. If you need help quitting, ask your health care provider.  Do not use street drugs.  Do not share needles.  Ask your health care provider for help if you need support or information about quitting drugs. Alcohol use  Do not drink alcohol if: ? Your health care provider tells you not to drink. ? You are pregnant, may be pregnant, or are planning to become pregnant.  If you drink alcohol: ? Limit how much you use to 0-1 drink a day. ? Limit intake if you are breastfeeding.  Be aware of how much alcohol is in your drink. In the [place] one drink equals one 12 oz bottle of beer (355 mL), one 5 oz glass of wine (148 mL), or one 1 oz glass of hard liquor (44 mL). General  instructions  Schedule regular health, dental, and eye exams.  Stay current with your vaccines.  Tell your health care provider if: ? You often feel depressed. ? You have ever been abused or do not feel safe at home. Summary  Adopting a healthy lifestyle and getting preventive care are important in promoting health and wellness.  Follow your health care provider's instructions about healthy diet, exercising, and getting tested or screened for diseases.  Follow your health care provider's instructions on monitoring your cholesterol and blood pressure. This information is not intended to replace advice given to you by your health care provider. Make sure you discuss any questions you have with your health care provider. Document Revised: 08/19/2018 Document Reviewed: 08/19/2018 Elsevier Patient Education  2020 Elsevier Inc.  

## 2019-11-23 MED FILL — LEVOTHYROXINE 175 MCG TABLE: 175 | 90 days supply | Qty: 90 | Fill #0

## 2019-12-20 MED FILL — TRANEXAMIC ACID 650 MG TAB: 650 | 84 days supply | Qty: 90 | Fill #1

## 2020-03-09 MED FILL — LEVOTHYROXINE 175 MCG TABLE: 175 | 90 days supply | Qty: 90 | Fill #1

## 2020-05-18 LAB — HM MAMMOGRAPHY

## 2020-05-26 ENCOUNTER — Encounter: Payer: Self-pay | Admitting: Physician Assistant

## 2020-06-15 ENCOUNTER — Other Ambulatory Visit: Payer: Self-pay | Admitting: Physician Assistant

## 2020-06-15 MED FILL — LEVOTHYROXINE 175 MCG TABLE: 175 | 90 days supply | Qty: 90 | Fill #0

## 2020-07-03 ENCOUNTER — Encounter: Payer: Self-pay | Admitting: Physician Assistant

## 2020-07-17 ENCOUNTER — Other Ambulatory Visit: Payer: Self-pay | Admitting: Physician Assistant

## 2020-07-17 ENCOUNTER — Ambulatory Visit (INDEPENDENT_AMBULATORY_CARE_PROVIDER_SITE_OTHER): Payer: No Typology Code available for payment source | Admitting: Physician Assistant

## 2020-07-17 ENCOUNTER — Encounter: Payer: Self-pay | Admitting: Physician Assistant

## 2020-07-17 ENCOUNTER — Other Ambulatory Visit: Payer: Self-pay

## 2020-07-17 ENCOUNTER — Ambulatory Visit (INDEPENDENT_AMBULATORY_CARE_PROVIDER_SITE_OTHER)
Admission: RE | Admit: 2020-07-17 | Discharge: 2020-07-17 | Disposition: A | Discharge status: Home / Self Care | Payer: No Typology Code available for payment source | Source: Ambulatory Visit | Attending: Physician Assistant | Admitting: Physician Assistant

## 2020-07-17 VITALS — BP 124/80 | HR 86 | Temp 98.2°F | Ht 64.0 in

## 2020-07-17 DIAGNOSIS — F321 Major depressive disorder, single episode, moderate: Secondary | ICD-10-CM

## 2020-07-17 DIAGNOSIS — F419 Anxiety disorder, unspecified: Secondary | ICD-10-CM

## 2020-07-17 DIAGNOSIS — M545 Low back pain, unspecified: Secondary | ICD-10-CM

## 2020-07-17 MED ORDER — FLUOXETINE HCL 20 MG PO CAPS: 20.0000 mg | ORAL_CAPSULE | Freq: Every morning | ORAL | 1 refills | Status: DC

## 2020-07-17 MED ORDER — CYCLOBENZAPRINE HCL 10 MG PO TABS: 10.0000 mg | ORAL_TABLET | Freq: Three times a day (TID) | ORAL | 0 refills | Status: DC | PRN

## 2020-07-17 MED ORDER — ALPRAZOLAM 0.5 MG PO TABS: 0.5000 mg | ORAL_TABLET | Freq: Every evening | ORAL | 0 refills | Status: DC | PRN

## 2020-07-17 MED FILL — CYCLOBENZAPRINE HCL 10 MG T: 10 | 10 days supply | Qty: 30 | Fill #0

## 2020-07-17 MED FILL — FLUoxetine HCL 20 MG CAPS: 20 | 30 days supply | Qty: 30 | Fill #0

## 2020-07-17 MED FILL — ALPRAZolam 0.5 MG TABS: 0.5 | 30 days supply | Qty: 30 | Fill #0

## 2020-07-17 NOTE — Patient Instructions (Signed)
It was great to see you!  Start Prozac 20 mg daily. Referral for therapy placed, someone should be in touch soon.  Flexeril prn sent for your back.  An order for an xray has been put in for you. To get your xray, you can walk in at the [facility] location without a scheduled appointment.  The address is [address] It is across the street from [facility]. X-ray is located in the basement.  Hours of operation are M-F 8:30am to 5:00pm. Please note that they are closed for lunch between 12:30 and 1:00pm.  Take care,  Jarold Motto PA-C

## 2020-07-17 NOTE — Progress Notes (Signed)
Shannon Wagner is a 44 y.o. female is here for follow up.  I acted as a Neurosurgeon for [employer], PA-C [employer], LPN   History of Present Illness:   Chief Complaint  Patient presents with   Anxiety   Depression   Back Pain    HPI   Anxiety / Depression Pt here for follow up, currently taking Alprazolam 0.5 mg hs prn. Pt has had increase in anxiety and depression for the past 2 months. Specific triggers include: work, family and the pandemic. Denies SI/HI. Husband recently dx with RA. She did take Zoloft about 11 years ago. Did have some issues with libido but did feel like it helped her mood.  GAD 7 : Generalized Anxiety Score 07/17/2020 11/09/2019 09/28/2019  Nervous, Anxious, on Edge 2 1 1   Control/stop worrying 2 1 2   Worry too much - different things 2 1 1   Trouble relaxing 2 1 2   Restless 1 0 0  Easily annoyed or irritable 2 0 1  Afraid - awful might happen 1 1 0  Total GAD 7 Score 12 5 7   Anxiety Difficulty Somewhat difficult Somewhat difficult Somewhat difficult    Depression screen [facility] 2/9 07/17/2020 11/09/2019 04/13/2018 04/10/2017 01/01/2016  Decreased Interest 1 0 0 0 0  Down, Depressed, Hopeless 2 0 0 0 0  PHQ - 2 Score 3 0 0 0 0  Altered sleeping 2 1 - - -  Tired, decreased energy 2 1 - - -  Change in appetite 1 1 - - -  Feeling bad or failure about yourself  1 0 - - -  Trouble concentrating 1 0 - - -  Moving slowly or fidgety/restless 0 0 - - -  Suicidal thoughts 0 0 - - -  PHQ-9 Score 10 3 - - -  Difficult doing work/chores Somewhat difficult Not difficult at all - - -    Back pain Pt c/o low back pain off and on x 2 months, gets worse after activity. Using Ibuprofen with some relief. 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 Denies: fever, chills, radiation of pain down leg, weakness, tingling, saddle anesthesia, bowel/bladder incontinence. Denies any known trauma.    Health Maintenance Due  Topic Date Due    Hepatitis C Screening  Never done   PAP SMEAR-Modifier  01/03/2020    Past Medical History:  Diagnosis Date   Acquired hypothyroidism 04/10/2017   Obesity (BMI 30-39.9) 04/10/2017     Social History   Tobacco Use   Smoking status: Never Smoker   Smokeless tobacco: Never Used  Vaping Use   Vaping Use: Never used  Substance Use Topics   Alcohol use: Yes    Comment: social    Drug use: No    History reviewed. No pertinent surgical history.  Family History  Problem Relation Age of Onset   Cancer Mother    Diabetes Father    Heart disease Father    Cancer Maternal Grandmother    Heart disease Paternal Grandmother    Heart disease Paternal Grandfather     PMHx, SurgHx, SocialHx, FamHx, Medications, and Allergies were reviewed in the Visit Navigator and updated as appropriate.   Patient Active Problem List   Diagnosis Date Noted   Family history of long QT syndrome 11/06/2018   Family history of early CAD 11/06/2018   Acquired hypothyroidism 04/10/2017   Obesity (BMI 30-39.9) 04/10/2017    Social History   Tobacco  Use   Smoking status: Never Smoker   Smokeless tobacco: Never Used  Vaping Use   Vaping Use: Never used  Substance Use Topics   Alcohol use: Yes    Comment: social    Drug use: No    Current Medications and Allergies:    Current Outpatient Medications:    ALPRAZolam (XANAX) 0.5 MG tablet, Take 1 tablet (0.5 mg total) by mouth at bedtime as needed for anxiety., Disp: 20 tablet, Rfl: 0   cetirizine (ZYRTEC) 5 MG tablet, Take 5 mg by mouth daily., Disp: , Rfl:    Cholecalciferol (VITAMIN D3) 125 MCG (5000 UT) CAPS, Take 1 capsule by mouth daily., Disp: , Rfl:    diphenhydrAMINE (BENADRYL) 25 MG tablet, Take 25 mg by mouth every 6 (six) hours as needed for sleep., Disp: , Rfl:    ibuprofen (ADVIL,MOTRIN) 600 MG tablet, Take 1 tablet (600 mg total) by mouth every 6 (six) hours., Disp: 30 tablet, Rfl: 0   levothyroxine  (SYNTHROID) 175 MCG tablet, TAKE 1 TABLET (175 MCG TOTAL) BY MOUTH DAILY BEFORE BREAKFAST., Disp: 90 tablet, Rfl: 1   Multiple Vitamin (MULTI VITAMIN PO), Take by mouth., Disp: , Rfl:    PARAGARD INTRAUTERINE COPPER IU, by Intrauterine route., Disp: , Rfl:    cyclobenzaprine (FLEXERIL) 10 MG tablet, Take 1 tablet (10 mg total) by mouth 3 (three) times daily as needed for muscle spasms., Disp: 30 tablet, Rfl: 0   FLUoxetine (PROZAC) 20 MG capsule, Take 1 capsule (20 mg total) by mouth every morning., Disp: 30 capsule, Rfl: 1  No Known Allergies  Review of Systems   ROS  Negative unless otherwise specified per HPI.  Vitals:   Vitals:   07/17/20 0825  BP: 124/80  Pulse: 86  Temp: 98.2 F (36.8 C)  TempSrc: Temporal  SpO2: 98%  Height: 5\' 4"  (1.626 m)     Body mass index is 32.44 kg/m.   Physical Exam:    Physical Exam Constitutional:      Appearance: She is well-developed.  HENT:     Head: Normocephalic and atraumatic.  Eyes:     Conjunctiva/sclera: Conjunctivae normal.  Pulmonary:     Effort: Pulmonary effort is normal.  Musculoskeletal:        General: Normal range of motion.     Cervical back: Normal range of motion and neck supple.     Comments: No decreased ROM 2/2 pain with flexion/extension or rotation.   Does have decreased ROM with lateral side bend to left.  Reproducible tenderness with deep palpation to right lumbar paraspinal muscles. No bony tenderness. No evidence of erythema, rash or ecchymosis. Negative STLR bilaterally.   Skin:    General: Skin is warm and dry.  Neurological:     Mental Status: She is alert and oriented to person, place, and time.  Psychiatric:        Behavior: Behavior normal.        Thought Content: Thought content normal.        Judgment: Judgment normal.      Assessment and Plan:    Shannon Wagner was seen today for anxiety, depression and back pain.  Diagnoses and all orders for this visit:  Acute right-sided low  back pain without sciatica Suspect likely muscle strain, however given chronicity, will obtain imaging. Also provided flexeril prn for her to use if returns. Also recommend ibuprofen if symptoms return. Red flags reviewed and worsening precautions advised if they were to develop. -  DG Lumbar Spine Complete; Future  Anxiety; Depression, major, single episode, moderate (HCC) Uncontrolled. Start prozac 20 mg daily. Follow-up in 1 month (can send MyChart message if prefers.) Continue xanax prn. Therapy referral placed. I discussed with patient that if they develop any SI, to tell someone immediately and seek medical attention. -     Ambulatory referral to Psychology  Other orders -     FLUoxetine (PROZAC) 20 MG capsule; Take 1 capsule (20 mg total) by mouth every morning. -     cyclobenzaprine (FLEXERIL) 10 MG tablet; Take 1 tablet (10 mg total) by mouth 3 (three) times daily as needed for muscle spasms.     CMA or LPN served as scribe during this visit. History, Physical, and Plan performed by medical provider. The above documentation has been reviewed and is accurate and complete.   Jarold Motto, PA-C [place], [place] 07/17/2020  Follow-up: No follow-ups on file.

## 2020-08-14 MED FILL — FLUoxetine HCL 20 MG CAPS: 20 | 30 days supply | Qty: 30 | Fill #1

## 2020-08-29 ENCOUNTER — Ambulatory Visit: Payer: No Typology Code available for payment source | Admitting: Psychology

## 2020-09-08 ENCOUNTER — Encounter: Payer: Self-pay | Admitting: Physician Assistant

## 2020-09-08 ENCOUNTER — Other Ambulatory Visit: Payer: Self-pay | Admitting: Physician Assistant

## 2020-09-08 MED FILL — LEVOTHYROXINE 175 MCG TABLE: 175 | 90 days supply | Qty: 90 | Fill #1

## 2020-09-11 ENCOUNTER — Other Ambulatory Visit: Payer: Self-pay | Admitting: Physician Assistant

## 2020-09-11 MED FILL — FLUoxetine HCL 20 MG CAPS: 20 | 90 days supply | Qty: 90 | Fill #0

## 2020-09-26 ENCOUNTER — Ambulatory Visit: Payer: No Typology Code available for payment source | Admitting: Psychology

## 2020-10-24 ENCOUNTER — Ambulatory Visit: Payer: No Typology Code available for payment source | Admitting: Psychology

## 2020-10-24 ENCOUNTER — Ambulatory Visit (INDEPENDENT_AMBULATORY_CARE_PROVIDER_SITE_OTHER): Payer: No Typology Code available for payment source | Admitting: Psychology

## 2020-10-24 DIAGNOSIS — F411 Generalized anxiety disorder: Secondary | ICD-10-CM | POA: Diagnosis not present

## 2020-11-16 ENCOUNTER — Ambulatory Visit (INDEPENDENT_AMBULATORY_CARE_PROVIDER_SITE_OTHER): Payer: No Typology Code available for payment source | Admitting: Psychology

## 2020-11-16 DIAGNOSIS — F411 Generalized anxiety disorder: Secondary | ICD-10-CM

## 2020-11-30 ENCOUNTER — Ambulatory Visit (INDEPENDENT_AMBULATORY_CARE_PROVIDER_SITE_OTHER): Payer: No Typology Code available for payment source | Admitting: Psychology

## 2020-11-30 DIAGNOSIS — F411 Generalized anxiety disorder: Secondary | ICD-10-CM

## 2020-12-01 ENCOUNTER — Other Ambulatory Visit: Payer: Self-pay

## 2020-12-01 ENCOUNTER — Emergency Department (HOSPITAL_COMMUNITY)
Admission: EM | Admit: 2020-12-01 | Discharge: 2020-12-02 | Disposition: A | Discharge status: Home / Self Care | Payer: No Typology Code available for payment source | Attending: Emergency Medicine | Admitting: Emergency Medicine

## 2020-12-01 ENCOUNTER — Encounter (HOSPITAL_COMMUNITY): Payer: Self-pay | Admitting: Emergency Medicine

## 2020-12-01 DIAGNOSIS — S0181XA Laceration without foreign body of other part of head, initial encounter: Secondary | ICD-10-CM | POA: Insufficient documentation

## 2020-12-01 DIAGNOSIS — F10129 Alcohol abuse with intoxication, unspecified: Secondary | ICD-10-CM | POA: Insufficient documentation

## 2020-12-01 DIAGNOSIS — S0990XA Unspecified injury of head, initial encounter: Secondary | ICD-10-CM | POA: Diagnosis present

## 2020-12-01 DIAGNOSIS — F10920 Alcohol use, unspecified with intoxication, uncomplicated: Secondary | ICD-10-CM

## 2020-12-01 DIAGNOSIS — E039 Hypothyroidism, unspecified: Secondary | ICD-10-CM | POA: Diagnosis not present

## 2020-12-01 DIAGNOSIS — Z79899 Other long term (current) drug therapy: Secondary | ICD-10-CM | POA: Insufficient documentation

## 2020-12-01 DIAGNOSIS — W01198A Fall on same level from slipping, tripping and stumbling with subsequent striking against other object, initial encounter: Secondary | ICD-10-CM | POA: Insufficient documentation

## 2020-12-01 DIAGNOSIS — W19XXXA Unspecified fall, initial encounter: Secondary | ICD-10-CM

## 2020-12-01 DIAGNOSIS — Z23 Encounter for immunization: Secondary | ICD-10-CM | POA: Insufficient documentation

## 2020-12-01 MED ORDER — TETANUS-DIPHTH-ACELL PERTUSSIS 5-2.5-18.5 LF-MCG/0.5 IM SUSY
0.5000 mL | PREFILLED_SYRINGE | Freq: Once | INTRAMUSCULAR | Status: AC
  Administered 2020-12-01: 0.5 mL via INTRAMUSCULAR
  Filled 2020-12-01: qty 0.5

## 2020-12-01 MED ORDER — LIDOCAINE-EPINEPHRINE 1 %-1:100000 IJ SOLN
10.0000 mL | Freq: Once | INTRAMUSCULAR | Status: AC
  Administered 2020-12-01: 10 mL
  Filled 2020-12-01: qty 1

## 2020-12-01 NOTE — ED Notes (Signed)
Lidocaine Vial set up at bedside for PA to suture laceration .

## 2020-12-01 NOTE — ED Triage Notes (Signed)
Patient here after falling, states she hit the ground.  She does not have any recall of incident, states she passed out.  Patient does admit to ETOH on board. Laceration lateral of right eye.  Bleeding controlled.

## 2020-12-01 NOTE — ED Provider Notes (Signed)
[facility] EMERGENCY DEPARTMENT Provider Note   CSN: [id number] Arrival date & time: 12/01/20  2242     History Chief Complaint  Patient presents with  . Fall  . Facial Laceration    Shannon Wagner is a 45 y.o. female.  Patient to ED after losing her balance, falling forward onto furniture causing a laceration to right forehead. The patient is here with a friend who witnessed the fall and confirms she is significantly intoxicated. There was no LOC. No vomiting since the fall. No other injury.   The history is provided by the patient. No language interpreter was used.       Past Medical History:  Diagnosis Date  . Acquired hypothyroidism 04/10/2017  . Obesity (BMI 30-39.9) 04/10/2017    Patient Active Problem List   Diagnosis Date Noted  . Family history of long QT syndrome 11/06/2018  . Family history of early CAD 11/06/2018  . Acquired hypothyroidism 04/10/2017  . Obesity (BMI 30-39.9) 04/10/2017    History reviewed. No pertinent surgical history.   OB History    Gravida  2   Para  2   Term  2   Preterm      AB      Living  2     SAB      IAB      Ectopic      Multiple      Live Births  1           Family History  Problem Relation Age of Onset  . Cancer Mother   . Diabetes Father   . Heart disease Father   . Cancer Maternal Grandmother   . Heart disease Paternal Grandmother   . Heart disease Paternal Grandfather     Social History   Tobacco Use  . Smoking status: Never Smoker  . Smokeless tobacco: Never Used  Vaping Use  . Vaping Use: Never used  Substance Use Topics  . Alcohol use: Yes    Comment: social   . Drug use: No    Home Medications Prior to Admission medications   Medication Sig Start Date End Date Taking? Authorizing Provider  ALPRAZolam Prudy Feeler) 0.5 MG tablet Take 1 tablet (0.5 mg total) by mouth at bedtime as needed for anxiety. 07/17/20   Jarold Motto, PA  cetirizine (ZYRTEC) 5 MG tablet  Take 5 mg by mouth daily.    [provider]  Cholecalciferol (VITAMIN D3) 125 MCG (5000 UT) CAPS Take 1 capsule by mouth daily.    [provider]  cyclobenzaprine (FLEXERIL) 10 MG tablet Take 1 tablet (10 mg total) by mouth 3 (three) times daily as needed for muscle spasms. 07/17/20   Jarold Motto, PA  diphenhydrAMINE (BENADRYL) 25 MG tablet Take 25 mg by mouth every 6 (six) hours as needed for sleep.    [provider]  FLUoxetine (PROZAC) 20 MG capsule TAKE 1 CAPSULE (20 MG TOTAL) BY MOUTH EVERY MORNING. 09/11/20   Jarold Motto, PA  ibuprofen (ADVIL,MOTRIN) 600 MG tablet Take 1 tablet (600 mg total) by mouth every 6 (six) hours. 02/22/14   Raelyn Mora, CNM  levothyroxine (SYNTHROID) 175 MCG tablet TAKE 1 TABLET (175 MCG TOTAL) BY MOUTH DAILY BEFORE BREAKFAST. 06/15/20   Jarold Motto, PA  Multiple Vitamin (MULTI VITAMIN PO) Take by mouth.    [provider]  PARAGARD INTRAUTERINE COPPER IU by Intrauterine route.    [provider]    Allergies    Patient  has no known allergies.  Review of Systems   Review of Systems  Eyes: Negative for visual disturbance.  Respiratory: Negative for shortness of breath.   Cardiovascular: Negative for chest pain.  Gastrointestinal: Negative for abdominal pain, nausea and vomiting.  Musculoskeletal: Negative for back pain and neck pain.  Skin: Positive for wound.  Neurological: Negative for syncope.    Physical Exam Updated Vital Signs BP 120/85 (BP Location: Right Arm)   Pulse 92   Temp 98.9 F (37.2 C)   Resp 15   SpO2 97%   Physical Exam Vitals and nursing note reviewed.  Constitutional:      General: She is not in acute distress.    Appearance: She is obese.     Comments: Patient is awake, acutely intoxicated.  HENT:     Head: Normocephalic.     Nose: Nose normal.     Mouth/Throat:     Mouth: Mucous membranes are moist.  Eyes:     Conjunctiva/sclera: Conjunctivae normal.      Pupils: Pupils are equal, round, and reactive to light.  Cardiovascular:     Rate and Rhythm: Normal rate and regular rhythm.     Heart sounds: No murmur heard.   Pulmonary:     Effort: Pulmonary effort is normal.     Breath sounds: No wheezing, rhonchi or rales.  Chest:     Chest wall: No tenderness.  Abdominal:     Tenderness: There is no abdominal tenderness.  Musculoskeletal:        General: Normal range of motion.     Cervical back: Normal range of motion and neck supple.     Comments: Moves all extremities. No midline cervical tenderness.   Skin:    General: Skin is warm and dry.     Comments: 4 cm linear, full thickness laceration to right temple. Bleeding controlled.   Neurological:     Mental Status: She is alert.     Sensory: No sensory deficit.     Comments: Follows command. Speech slurred but oriented.      ED Results / Procedures / Treatments   Labs (all labs ordered are listed, but only abnormal results are displayed) Labs Reviewed - No data to display  EKG None  Radiology No results found.  Procedures .Marland KitchenLaceration Repair  Date/Time: 12/01/2020 11:29 PM Performed by: Elpidio Anis, PA-C Authorized by: Elpidio Anis, PA-C   Consent:    Consent obtained:  Verbal   Consent given by:  Patient Universal protocol:    Procedure explained and questions answered to patient or proxy's satisfaction: yes     Patient identity confirmed:  Verbally with patient and arm band Laceration details:    Location:  Face   Face location:  Forehead   Length (cm):  4 Pre-procedure details:    Preparation:  Patient was prepped and draped in usual sterile fashion and imaging obtained to evaluate for foreign bodies Exploration:    Limited defect created (wound extended): no     Hemostasis achieved with:  Direct pressure   Imaging outcome: foreign body not noted     Wound exploration: entire depth of wound visualized     Wound extent: no foreign bodies/material noted      Contaminated: no   Treatment:    Area cleansed with:  Povidone-iodine and saline   Amount of cleaning:  Standard   Debridement:  None   Layers/structures repaired:  Deep subcutaneous Deep subcutaneous:    Suture size:  6-0  Suture material:  Vicryl   Suture technique:  Simple interrupted   Number of sutures:  3 Skin repair:    Repair method:  Tissue adhesive Approximation:    Approximation:  Close Repair type:    Repair type:  Intermediate Post-procedure details:    Dressing:  Open (no dressing)   Procedure completion:  Tolerated well, no immediate complications     Medications Ordered in ED Medications  Tdap (BOOSTRIX) injection 0.5 mL (has no administration in time range)  lidocaine-EPINEPHrine (XYLOCAINE W/EPI) 2 %-1:200000 (PF) injection 10 mL (has no administration in time range)    ED Course  I have reviewed the triage vital signs and the nursing notes.  Pertinent labs & imaging results that were available during my care of the patient were reviewed by me and considered in my medical decision making (see chart for details).    MDM Rules/Calculators/A&P                          Patient to ED after fall with facial trauma while intoxicated. Here with friend who witnessed fall.   Head CT, c-spine CT negative for acute injury. Wound repaired as per above procedure not. Tetanus updated.   She has been observed to ambulate to the bathroom. She will be taken home by her friend at bedside. Stable for discharge home.   Final Clinical Impression(s) / ED Diagnoses Final diagnoses:  None   1. Fall 2. Facial laceration 3. Alcohol intoxication  Rx / DC Orders ED Discharge Orders    None       Elpidio Anis, PA-C 12/02/20 0144    Gilda Crease, MD 12/02/20 [phone]

## 2020-12-01 NOTE — ED Notes (Signed)
Pt ambulated to the restroom with assistance.  

## 2020-12-02 ENCOUNTER — Emergency Department (HOSPITAL_COMMUNITY): Payer: No Typology Code available for payment source

## 2020-12-02 NOTE — ED Notes (Signed)
Patient transported to CT scan . 

## 2020-12-02 NOTE — Discharge Instructions (Addendum)
Follow up with your doctor as needed.   You were evaluated with head and neck CT imaging that were negative for any serious injury.   Return to the ED with any new or concerning symptoms.

## 2020-12-14 ENCOUNTER — Other Ambulatory Visit (HOSPITAL_COMMUNITY): Payer: Self-pay

## 2020-12-14 ENCOUNTER — Other Ambulatory Visit: Payer: Self-pay | Admitting: Physician Assistant

## 2020-12-14 ENCOUNTER — Ambulatory Visit (INDEPENDENT_AMBULATORY_CARE_PROVIDER_SITE_OTHER): Payer: No Typology Code available for payment source | Admitting: Psychology

## 2020-12-14 DIAGNOSIS — F411 Generalized anxiety disorder: Secondary | ICD-10-CM

## 2020-12-14 MED ORDER — LEVOTHYROXINE SODIUM 175 MCG PO TABS
175.0000 ug | ORAL_TABLET | Freq: Every day | ORAL | 0 refills | Status: DC
  Filled 2020-12-14: qty 30, 30d supply, fill #0

## 2020-12-14 MED FILL — Fluoxetine HCl Cap 20 MG: ORAL | 90 days supply | Qty: 90 | Fill #0 | Status: AC

## 2021-01-01 ENCOUNTER — Ambulatory Visit (INDEPENDENT_AMBULATORY_CARE_PROVIDER_SITE_OTHER): Payer: No Typology Code available for payment source | Admitting: Psychology

## 2021-01-01 DIAGNOSIS — F411 Generalized anxiety disorder: Secondary | ICD-10-CM

## 2021-01-10 ENCOUNTER — Telehealth: Payer: Self-pay

## 2021-01-10 ENCOUNTER — Other Ambulatory Visit (HOSPITAL_COMMUNITY): Payer: Self-pay

## 2021-01-10 MED ORDER — LEVOTHYROXINE SODIUM 175 MCG PO TABS
175.0000 ug | ORAL_TABLET | Freq: Every day | ORAL | 0 refills | Status: DC
  Filled 2021-02-07: qty 30, 30d supply, fill #0

## 2021-01-10 MED ORDER — LEVOTHYROXINE SODIUM 175 MCG PO TABS
175.0000 ug | ORAL_TABLET | Freq: Every day | ORAL | 0 refills | Status: DC
  Filled 2021-01-10: qty 30, 30d supply, fill #0

## 2021-01-10 NOTE — Telephone Encounter (Signed)
Left message on voicemail to call office.  

## 2021-01-10 NOTE — Telephone Encounter (Signed)
Pt called back told her I have sent prescription to the pharmacy.

## 2021-01-10 NOTE — Telephone Encounter (Signed)
..   LAST APPOINTMENT DATE: 09/11/2020   NEXT APPOINTMENT DATE: 01/29/2021 for CPE w/ Hyman Hopes  MEDICATION:  Levothyroxine   PHARMACY:  [facility]  Let patient know to contact pharmacy at the end of the day to make sure medication is ready.  Please notify patient to allow 48-72 hours to process  Encourage patient to contact the pharmacy for refills or they can request refills through [facility]  CLINICAL FILLS OUT ALL BELOW:   LAST REFILL:  QTY:  REFILL DATE:    OTHER COMMENTS:    Okay for refill?  Please advise

## 2021-01-10 NOTE — Telephone Encounter (Signed)
Patient states her gyn has left.    Would like to know if Sam could take over her paps?  States she has never had an abnormal pap.

## 2021-01-10 NOTE — Telephone Encounter (Signed)
Pt called back told her we can gladly do her pap smear with her physical when she comes in just let the nurse know that you would like your pap smear done that day. Pt verbalized understanding.

## 2021-01-25 ENCOUNTER — Ambulatory Visit: Payer: No Typology Code available for payment source | Admitting: Psychology

## 2021-01-29 ENCOUNTER — Other Ambulatory Visit: Payer: Self-pay

## 2021-01-29 ENCOUNTER — Ambulatory Visit (INDEPENDENT_AMBULATORY_CARE_PROVIDER_SITE_OTHER): Payer: No Typology Code available for payment source | Admitting: Family

## 2021-01-29 ENCOUNTER — Encounter: Payer: Self-pay | Admitting: Family

## 2021-01-29 ENCOUNTER — Other Ambulatory Visit (HOSPITAL_COMMUNITY)
Admission: RE | Admit: 2021-01-29 | Discharge: 2021-01-29 | Disposition: A | Discharge status: Home / Self Care | Payer: No Typology Code available for payment source | Source: Ambulatory Visit | Attending: Family | Admitting: Family

## 2021-01-29 VITALS — BP 122/80 | HR 59 | Temp 98.0°F | Ht 64.0 in | Wt 235.0 lb

## 2021-01-29 DIAGNOSIS — Z124 Encounter for screening for malignant neoplasm of cervix: Secondary | ICD-10-CM

## 2021-01-29 DIAGNOSIS — Z13220 Encounter for screening for lipoid disorders: Secondary | ICD-10-CM

## 2021-01-29 DIAGNOSIS — Z0000 Encounter for general adult medical examination without abnormal findings: Secondary | ICD-10-CM

## 2021-01-29 DIAGNOSIS — E038 Other specified hypothyroidism: Secondary | ICD-10-CM

## 2021-01-29 DIAGNOSIS — E669 Obesity, unspecified: Secondary | ICD-10-CM

## 2021-01-29 LAB — CBC WITH DIFFERENTIAL/PLATELET
Basophils Absolute: 0.1 10*3/uL (ref 0.0–0.1)
Basophils Relative: 0.6 % (ref 0.0–3.0)
Eosinophils Absolute: 0.1 10*3/uL (ref 0.0–0.7)
Eosinophils Relative: 1.1 % (ref 0.0–5.0)
HCT: 43.2 % (ref 36.0–46.0)
Hemoglobin: 14.6 g/dL (ref 12.0–15.0)
Lymphocytes Relative: 31.4 % (ref 12.0–46.0)
Lymphs Abs: 2.7 10*3/uL (ref 0.7–4.0)
MCHC: 33.8 g/dL (ref 30.0–36.0)
MCV: 93.2 fl (ref 78.0–100.0)
Monocytes Absolute: 0.5 10*3/uL (ref 0.1–1.0)
Monocytes Relative: 5.5 % (ref 3.0–12.0)
Neutro Abs: 5.2 10*3/uL (ref 1.4–7.7)
Neutrophils Relative %: 61.4 % (ref 43.0–77.0)
Platelets: 329 10*3/uL (ref 150.0–400.0)
RBC: 4.63 Mil/uL (ref 3.87–5.11)
RDW: 13.5 % (ref 11.5–15.5)
WBC: 8.6 10*3/uL (ref 4.0–10.5)

## 2021-01-29 LAB — COMPREHENSIVE METABOLIC PANEL
ALT: 13 U/L (ref 0–35)
AST: 14 U/L (ref 0–37)
Albumin: 4.4 g/dL (ref 3.5–5.2)
Alkaline Phosphatase: 61 U/L (ref 39–117)
BUN: 12 mg/dL (ref 6–23)
CO2: 23 mEq/L (ref 19–32)
Calcium: 9.3 mg/dL (ref 8.4–10.5)
Chloride: 102 mEq/L (ref 96–112)
Creatinine, Ser: 0.66 mg/dL (ref 0.40–1.20)
GFR: 107.69 mL/min (ref >60.00)
Glucose, Bld: 76 mg/dL (ref 70–99)
Potassium: 4 mEq/L (ref 3.5–5.1)
Sodium: 138 mEq/L (ref 135–145)
Total Bilirubin: 0.6 mg/dL (ref 0.2–1.2)
Total Protein: 7.2 g/dL (ref 6.0–8.3)

## 2021-01-29 LAB — LIPID PANEL
Cholesterol: 166 mg/dL (ref 0–200)
HDL: 61.7 mg/dL (ref >39.00)
LDL Cholesterol: 89 mg/dL (ref 0–99)
NonHDL: 104.49
Total CHOL/HDL Ratio: 3
Triglycerides: 78 mg/dL (ref 0.0–149.0)
VLDL: 15.6 mg/dL (ref 0.0–40.0)

## 2021-01-29 LAB — TSH: TSH: 0.58 u[IU]/mL (ref 0.35–4.50)

## 2021-01-29 NOTE — Progress Notes (Signed)
Established Patient Office Visit  Subjective:  Patient ID: MERYL HUBERS, female    DOB: [birth year]  Age: 45 y.o. MRN: [id number]  CC:  Chief Complaint  Patient presents with  . Annual Exam    HPI Librada Castronovo Slavick presents for CPX. Denies any concerns. Would like a referral to weight management clinic for obesity. She has a mammogram scheduled for June. Takes Prozac and sees Colen Darling for therapy and doing well.   Past Medical History:  Diagnosis Date  . Acquired hypothyroidism 04/10/2017  . Obesity (BMI 30-39.9) 04/10/2017    History reviewed. No pertinent surgical history.  Family History  Problem Relation Age of Onset  . Cancer Mother   . Diabetes Father   . Heart disease Father   . Cancer Maternal Grandmother   . Heart disease Paternal Grandmother   . Heart disease Paternal Grandfather     Social History   Socioeconomic History  . Marital status: Married    Spouse name: Not on file  . Number of children: Not on file  . Years of education: Not on file  . Highest education level: Not on file  Occupational History  . Not on file  Tobacco Use  . Smoking status: Never Smoker  . Smokeless tobacco: Never Used  Vaping Use  . Vaping Use: Never used  Substance and Sexual Activity  . Alcohol use: Yes    Comment: social   . Drug use: No  . Sexual activity: Yes  Other Topics Concern  . Not on file  Social History Narrative   Married, 2 children (9 and 5)   OR nurse   Social Determinants of Health   Financial Resource Strain: Not on file  Food Insecurity: Not on file  Transportation Needs: Not on file  Physical Activity: Not on file  Stress: Not on file  Social Connections: Not on file  Intimate Partner Violence: Not on file    Outpatient Medications Prior to Visit  Medication Sig Dispense Refill  . Cholecalciferol (VITAMIN D3) 125 MCG (5000 UT) CAPS Take 1 capsule by mouth daily.    . cyclobenzaprine (FLEXERIL) 10 MG tablet TAKE 1 TABLET (10 MG TOTAL) BY  MOUTH 3 (THREE) TIMES DAILY AS NEEDED FOR MUSCLE SPASMS. 30 tablet 0  . diphenhydrAMINE (BENADRYL) 25 MG tablet Take 25 mg by mouth every 6 (six) hours as needed for sleep.    Marland Kitchen FLUoxetine (PROZAC) 20 MG capsule TAKE 1 CAPSULE (20 MG TOTAL) BY MOUTH EVERY MORNING. 90 capsule 1  . ibuprofen (ADVIL,MOTRIN) 600 MG tablet Take 1 tablet (600 mg total) by mouth every 6 (six) hours. 30 tablet 0  . levothyroxine (SYNTHROID) 175 MCG tablet Take 1 tablet (175 mcg total) by mouth daily before breakfast. 30 tablet 0  . Multiple Vitamin (MULTI VITAMIN PO) Take by mouth.    Marland Kitchen PARAGARD INTRAUTERINE COPPER IU by Intrauterine route.    . cetirizine (ZYRTEC) 5 MG tablet Take 5 mg by mouth daily. (Patient not taking: Reported on 01/29/2021)     No facility-administered medications prior to visit.    No Known Allergies  ROS Review of Systems  All other systems reviewed and are negative.     Objective:    Physical Exam Vitals and nursing note reviewed. Exam conducted with a chaperone present.  Constitutional:      Appearance: Normal appearance. She is obese.  HENT:     Head: Normocephalic and atraumatic.     Right Ear: Tympanic membrane and ear  canal normal.     Left Ear: Tympanic membrane and ear canal normal.     Nose: Nose normal.     Mouth/Throat:     Mouth: Mucous membranes are dry.     Pharynx: Oropharynx is clear.  Eyes:     Extraocular Movements: Extraocular movements intact.     Pupils: Pupils are equal, round, and reactive to light.  Cardiovascular:     Rate and Rhythm: Normal rate and regular rhythm.     Pulses: Normal pulses.     Heart sounds: Normal heart sounds.  Pulmonary:     Effort: Pulmonary effort is normal.     Breath sounds: Normal breath sounds.  Abdominal:     General: Bowel sounds are normal.     Palpations: Abdomen is soft.     Tenderness: There is no abdominal tenderness. There is no guarding or rebound.  Genitourinary:    General: Normal vulva.     Vagina: No  vaginal discharge.     Rectum: Normal.  Musculoskeletal:        General: Normal range of motion.     Cervical back: Normal range of motion and neck supple.  Skin:    General: Skin is warm and dry.  Neurological:     General: No focal deficit present.     Mental Status: She is alert and oriented to person, place, and time.  Psychiatric:        Mood and Affect: Mood normal.        Behavior: Behavior normal.     BP 122/80 (BP Location: Left Arm, Patient Position: Sitting, Cuff Size: Large)   Pulse (!) 59   Temp 98 F (36.7 C) (Temporal)   Ht 5\' 4"  (1.626 m)   Wt 235 lb (106.6 kg)   LMP 01/17/2021   SpO2 98%   BMI 40.34 kg/m  Wt Readings from Last 3 Encounters:  01/29/21 235 lb (106.6 kg)  12/25/17 189 lb (85.7 kg)  10/13/17 186 lb 3.2 oz (84.5 kg)     Health Maintenance Due  Topic Date Due  . Hepatitis C Screening  Never done  . PAP SMEAR-Modifier  01/03/2020  . COVID-19 Vaccine (3 - Booster for Pfizer series) 02/18/2020    There are no preventive care reminders to display for this patient.  Lab Results  Component Value Date   TSH 0.78 11/09/2019   Lab Results  Component Value Date   WBC 7.0 11/09/2019   HGB 14.8 11/09/2019   HCT 43.4 11/09/2019   MCV 93.0 11/09/2019   PLT 297.0 11/09/2019   Lab Results  Component Value Date   NA 138 11/09/2019   K 4.2 11/09/2019   CO2 25 11/09/2019   GLUCOSE 82 11/09/2019   BUN 9 11/09/2019   CREATININE 0.77 11/09/2019   BILITOT 0.5 11/09/2019   ALKPHOS 63 11/09/2019   AST 11 11/09/2019   ALT 10 11/09/2019   PROT 7.0 11/09/2019   ALBUMIN 4.2 11/09/2019   CALCIUM 9.6 11/09/2019   GFR 82.26 11/09/2019   Lab Results  Component Value Date   CHOL 167 11/09/2019   Lab Results  Component Value Date   HDL 58.60 11/09/2019   Lab Results  Component Value Date   LDLCALC 86 11/09/2019   Lab Results  Component Value Date   TRIG 108.0 11/09/2019   Lab Results  Component Value Date   CHOLHDL 3 11/09/2019   No  results found for: HGBA1C    Assessment & Plan:  Problem List Items Addressed This Visit   None   Visit Diagnoses    Encounter for wellness examination in adult    -  Primary   Relevant Orders   Cytology - PAP   Screening for malignant neoplasm of cervix       Relevant Orders   CBC with Differential/Platelet   Comprehensive metabolic panel   Lipid panel   TSH   Hepatitis C antibody   Other specified hypothyroidism       Relevant Orders   TSH   Obesity, unspecified classification, unspecified obesity type, unspecified whether serious comorbidity present       Relevant Orders   Amb Ref to Medical Weight Management   Screening for lipoid disorders          Encouraged a healthy diet and exercise. Referral placed. Call the office with any questions or concerns.   Follow-up: No follow-ups on file.    Eulis Foster, FNP

## 2021-01-29 NOTE — Patient Instructions (Signed)
 Health Maintenance, Female Adopting a healthy lifestyle and getting preventive care are important in promoting health and wellness. Ask your health care provider about:  The right schedule for you to have regular tests and exams.  Things you can do on your own to prevent diseases and keep yourself healthy. What should I know about diet, weight, and exercise? Eat a healthy diet  Eat a diet that includes plenty of vegetables, fruits, low-fat dairy products, and lean protein.  Do not eat a lot of foods that are high in solid fats, added sugars, or sodium.   Maintain a healthy weight Body mass index (BMI) is used to identify weight problems. It estimates body fat based on height and weight. Your health care provider can help determine your BMI and help you achieve or maintain a healthy weight. Get regular exercise Get regular exercise. This is one of the most important things you can do for your health. Most adults should:  Exercise for at least 150 minutes each week. The exercise should increase your heart rate and make you sweat (moderate-intensity exercise).  Do strengthening exercises at least twice a week. This is in addition to the moderate-intensity exercise.  Spend less time sitting. Even light physical activity can be beneficial. Watch cholesterol and blood lipids Have your blood tested for lipids and cholesterol at 43 years of age, then have this test every 5 years. Have your cholesterol levels checked more often if:  Your lipid or cholesterol levels are high.  You are older than 43 years of age.  You are at high risk for heart disease. What should I know about cancer screening? Depending on your health history and family history, you may need to have cancer screening at various ages. This may include screening for:  Breast cancer.  Cervical cancer.  Colorectal cancer.  Skin cancer.  Lung cancer. What should I know about heart disease, diabetes, and high blood  pressure? Blood pressure and heart disease  High blood pressure causes heart disease and increases the risk of stroke. This is more likely to develop in people who have high blood pressure readings, are of African descent, or are overweight.  Have your blood pressure checked: ? Every 3-5 years if you are 18-39 years of age. ? Every year if you are 40 years old or older. Diabetes Have regular diabetes screenings. This checks your fasting blood sugar level. Have the screening done:  Once every three years after age 40 if you are at a normal weight and have a low risk for diabetes.  More often and at a younger age if you are overweight or have a high risk for diabetes. What should I know about preventing infection? Hepatitis B If you have a higher risk for hepatitis B, you should be screened for this virus. Talk with your health care provider to find out if you are at risk for hepatitis B infection. Hepatitis C Testing is recommended for:  Everyone born from 1945 through 1965.  Anyone with known risk factors for hepatitis C. Sexually transmitted infections (STIs)  Get screened for STIs, including gonorrhea and chlamydia, if: ? You are sexually active and are younger than 43 years of age. ? You are older than 43 years of age and your health care provider tells you that you are at risk for this type of infection. ? Your sexual activity has changed since you were last screened, and you are at increased risk for chlamydia or gonorrhea. Ask your health care   provider if you are at risk.  Ask your health care provider about whether you are at high risk for HIV. Your health care provider may recommend a prescription medicine to help prevent HIV infection. If you choose to take medicine to prevent HIV, you should first get tested for HIV. You should then be tested every 3 months for as long as you are taking the medicine. Pregnancy  If you are about to stop having your period (premenopausal) and  you may become pregnant, seek counseling before you get pregnant.  Take 400 to 800 micrograms (mcg) of folic acid every day if you become pregnant.  Ask for birth control (contraception) if you want to prevent pregnancy. Osteoporosis and menopause Osteoporosis is a disease in which the bones lose minerals and strength with aging. This can result in bone fractures. If you are 65 years old or older, or if you are at risk for osteoporosis and fractures, ask your health care provider if you should:  Be screened for bone loss.  Take a calcium or vitamin D supplement to lower your risk of fractures.  Be given hormone replacement therapy (HRT) to treat symptoms of menopause. Follow these instructions at home: Lifestyle  Do not use any products that contain nicotine or tobacco, such as cigarettes, e-cigarettes, and chewing tobacco. If you need help quitting, ask your health care provider.  Do not use street drugs.  Do not share needles.  Ask your health care provider for help if you need support or information about quitting drugs. Alcohol use  Do not drink alcohol if: ? Your health care provider tells you not to drink. ? You are pregnant, may be pregnant, or are planning to become pregnant.  If you drink alcohol: ? Limit how much you use to 0-1 drink a day. ? Limit intake if you are breastfeeding.  Be aware of how much alcohol is in your drink. In the [place] one drink equals one 12 oz bottle of beer (355 mL), one 5 oz glass of wine (148 mL), or one 1 oz glass of hard liquor (44 mL). General instructions  Schedule regular health, dental, and eye exams.  Stay current with your vaccines.  Tell your health care provider if: ? You often feel depressed. ? You have ever been abused or do not feel safe at home. Summary  Adopting a healthy lifestyle and getting preventive care are important in promoting health and wellness.  Follow your health care provider's instructions about healthy  diet, exercising, and getting tested or screened for diseases.  Follow your health care provider's instructions on monitoring your cholesterol and blood pressure. This information is not intended to replace advice given to you by your health care provider. Make sure you discuss any questions you have with your health care provider. Document Revised: 08/19/2018 Document Reviewed: 08/19/2018 [employer] Patient Education  2021 [employer]   Fat and Cholesterol Restricted Eating Plan Getting too much fat and cholesterol in your diet may cause health problems. Choosing the right foods helps keep your fat and cholesterol at normal levels. This can keep you from getting certain diseases. Your doctor may recommend an eating plan that includes:  Total fat: ______% or less of total calories a day.  Saturated fat: ______% or less of total calories a day.  Cholesterol: less than _________mg a day.  Fiber: ______g a day. What are tips for following this plan? Meal planning  At meals, divide your plate into four equal parts: ? Fill one-half   of your plate with vegetables and green salads. ? Fill one-fourth of your plate with whole grains. ? Fill one-fourth of your plate with low-fat (lean) protein foods.  Eat fish that is high in omega-3 fats at least two times a week. This includes mackerel, tuna, sardines, and salmon.  Eat foods that are high in fiber, such as whole grains, beans, apples, broccoli, carrots, peas, and barley. General tips  Work with your doctor to lose weight if you need to.  Avoid: ? Foods with added sugar. ? Fried foods. ? Foods with partially hydrogenated oils.  Limit alcohol intake to no more than 1 drink a day for nonpregnant women and 2 drinks a day for men. One drink equals 12 oz of beer, 5 oz of wine, or 1 oz of hard liquor.   Reading food labels  Check food labels for: ? Trans fats. ? Partially hydrogenated oils. ? Saturated fat (g) in each  serving. ? Cholesterol (mg) in each serving. ? Fiber (g) in each serving.  Choose foods with healthy fats, such as: ? Monounsaturated fats. ? Polyunsaturated fats. ? Omega-3 fats.  Choose grain products that have whole grains. Look for the word "whole" as the first word in the ingredient list. Cooking  Cook foods using low-fat methods. These include baking, boiling, grilling, and broiling.  Eat more home-cooked foods. Eat at restaurants and buffets less often.  Avoid cooking using saturated fats, such as butter, cream, palm oil, palm kernel oil, and coconut oil. Recommended foods Fruits  All fresh, canned (in natural juice), or frozen fruits. Vegetables  Fresh or frozen vegetables (raw, steamed, roasted, or grilled). Green salads. Grains  Whole grains, such as whole wheat or whole grain breads, crackers, cereals, and pasta. Unsweetened oatmeal, bulgur, barley, quinoa, or brown rice. Corn or whole wheat flour tortillas. Meats and other protein foods  Ground beef (85% or leaner), grass-fed beef, or beef trimmed of fat. Skinless chicken or turkey. Ground chicken or turkey. Pork trimmed of fat. All fish and seafood. Egg whites. Dried beans, peas, or lentils. Unsalted nuts or seeds. Unsalted canned beans. Nut butters without added sugar or oil. Dairy  Low-fat or nonfat dairy products, such as skim or 1% milk, 2% or reduced-fat cheeses, low-fat and fat-free ricotta or cottage cheese, or plain low-fat and nonfat yogurt. Fats and oils  Tub margarine without trans fats. Light or reduced-fat mayonnaise and salad dressings. Avocado. Olive, canola, sesame, or safflower oils. The items listed above may not be a complete list of foods and beverages you can eat. Contact a dietitian for more information.   Foods to avoid Fruits  Canned fruit in heavy syrup. Fruit in cream or butter sauce. Fried fruit. Vegetables  Vegetables cooked in cheese, cream, or butter sauce. Fried  vegetables. Grains  White bread. White pasta. White rice. Cornbread. Bagels, pastries, and croissants. Crackers and snack foods that contain trans fat and hydrogenated oils. Meats and other protein foods  Fatty cuts of meat. Ribs, chicken wings, bacon, sausage, bologna, salami, chitterlings, fatback, hot dogs, bratwurst, and packaged lunch meats. Liver and organ meats. Whole eggs and egg yolks. Chicken and turkey with skin. Fried meat. Dairy  Whole or 2% milk, cream, half-and-half, and cream cheese. Whole milk cheeses. Whole-fat or sweetened yogurt. Full-fat cheeses. Nondairy creamers and whipped toppings. Processed cheese, cheese spreads, and cheese curds. Beverages  Alcohol. Sugar-sweetened drinks such as sodas, lemonade, and fruit drinks. Fats and oils  Butter, stick margarine, lard, shortening, ghee, or bacon fat.   Coconut, palm kernel, and palm oils. Sweets and desserts  Corn syrup, sugars, honey, and molasses. Candy. Jam and jelly. Syrup. Sweetened cereals. Cookies, pies, cakes, donuts, muffins, and ice cream. The items listed above may not be a complete list of foods and beverages you should avoid. Contact a dietitian for more information. Summary  Choosing the right foods helps keep your fat and cholesterol at normal levels. This can keep you from getting certain diseases.  At meals, fill one-half of your plate with vegetables and green salads.  Eat high-fiber foods, like whole grains, beans, apples, carrots, peas, and barley.  Limit added sugar, saturated fats, alcohol, and fried foods. This information is not intended to replace advice given to you by your health care provider. Make sure you discuss any questions you have with your health care provider. Document Revised: 12/29/2019 Document Reviewed: 12/29/2019 [employer] Patient Education  2021 [employer]  

## 2021-01-30 LAB — HEPATITIS C ANTIBODY
Hepatitis C Ab: NONREACTIVE
SIGNAL TO CUT-OFF: 0.01 (ref <1.00)

## 2021-01-31 LAB — CYTOLOGY - PAP
Comment: NEGATIVE
Diagnosis: NEGATIVE
Diagnosis: REACTIVE
High risk HPV: NEGATIVE

## 2021-02-07 ENCOUNTER — Other Ambulatory Visit (HOSPITAL_COMMUNITY): Payer: Self-pay

## 2021-02-13 ENCOUNTER — Other Ambulatory Visit: Payer: Self-pay

## 2021-02-13 ENCOUNTER — Other Ambulatory Visit (HOSPITAL_COMMUNITY): Payer: Self-pay

## 2021-02-13 ENCOUNTER — Encounter: Payer: Self-pay | Admitting: Physician Assistant

## 2021-02-13 MED ORDER — LEVOTHYROXINE SODIUM 175 MCG PO TABS
175.0000 ug | ORAL_TABLET | Freq: Every day | ORAL | 0 refills | Status: DC
  Filled 2021-02-13 (×2): qty 90, 90d supply, fill #0

## 2021-02-13 MED ORDER — LEVOTHYROXINE SODIUM 175 MCG PO TABS
175.0000 ug | ORAL_TABLET | Freq: Every day | ORAL | 0 refills | Status: DC
  Filled 2021-02-13: qty 90, 90d supply, fill #0

## 2021-03-12 ENCOUNTER — Other Ambulatory Visit: Payer: Self-pay | Admitting: Physician Assistant

## 2021-03-13 ENCOUNTER — Other Ambulatory Visit (HOSPITAL_COMMUNITY): Payer: Self-pay

## 2021-03-13 MED ORDER — FLUOXETINE HCL 20 MG PO CAPS
20.0000 mg | ORAL_CAPSULE | Freq: Every morning | ORAL | 1 refills | Status: DC
  Filled 2021-03-13: qty 90, 90d supply, fill #0
  Filled 2021-06-10: qty 90, 90d supply, fill #1

## 2021-04-19 ENCOUNTER — Encounter (INDEPENDENT_AMBULATORY_CARE_PROVIDER_SITE_OTHER): Payer: Self-pay | Admitting: Family Medicine

## 2021-04-19 ENCOUNTER — Other Ambulatory Visit: Payer: Self-pay

## 2021-04-19 ENCOUNTER — Ambulatory Visit (INDEPENDENT_AMBULATORY_CARE_PROVIDER_SITE_OTHER): Payer: No Typology Code available for payment source | Admitting: Family Medicine

## 2021-04-19 VITALS — BP 108/72 | HR 67 | Temp 98.2°F | Ht 64.0 in | Wt 236.0 lb

## 2021-04-19 DIAGNOSIS — D649 Anemia, unspecified: Secondary | ICD-10-CM

## 2021-04-19 DIAGNOSIS — Z9189 Other specified personal risk factors, not elsewhere classified: Secondary | ICD-10-CM | POA: Diagnosis not present

## 2021-04-19 DIAGNOSIS — Z6841 Body Mass Index (BMI) 40.0 and over, adult: Secondary | ICD-10-CM

## 2021-04-19 DIAGNOSIS — R5383 Other fatigue: Secondary | ICD-10-CM

## 2021-04-19 DIAGNOSIS — E038 Other specified hypothyroidism: Secondary | ICD-10-CM | POA: Diagnosis not present

## 2021-04-19 DIAGNOSIS — F39 Unspecified mood [affective] disorder: Secondary | ICD-10-CM

## 2021-04-19 DIAGNOSIS — E559 Vitamin D deficiency, unspecified: Secondary | ICD-10-CM | POA: Diagnosis not present

## 2021-04-19 DIAGNOSIS — E039 Hypothyroidism, unspecified: Secondary | ICD-10-CM

## 2021-04-19 DIAGNOSIS — R0602 Shortness of breath: Secondary | ICD-10-CM

## 2021-04-19 DIAGNOSIS — Z0289 Encounter for other administrative examinations: Secondary | ICD-10-CM

## 2021-04-19 DIAGNOSIS — E785 Hyperlipidemia, unspecified: Secondary | ICD-10-CM

## 2021-04-19 DIAGNOSIS — Z1331 Encounter for screening for depression: Secondary | ICD-10-CM

## 2021-04-20 LAB — FOLATE: Folate: >20 ng/mL (ref >3.0)

## 2021-04-20 LAB — VITAMIN B12: Vitamin B-12: 484 pg/mL (ref 232–1245)

## 2021-04-20 LAB — VITAMIN D 25 HYDROXY (VIT D DEFICIENCY, FRACTURES): Vit D, 25-Hydroxy: 53.6 ng/mL (ref 30.0–100.0)

## 2021-04-20 LAB — INSULIN, RANDOM: INSULIN: 10.2 u[IU]/mL (ref 2.6–24.9)

## 2021-04-20 LAB — HEMOGLOBIN A1C
Est. average glucose Bld gHb Est-mCnc: 100 mg/dL
Hgb A1c MFr Bld: 5.1 % (ref 4.8–5.6)

## 2021-04-23 NOTE — Progress Notes (Signed)
Office: [phone]  /  Fax: [phone]    Date: May 07, 2021   Appointment Start Time: 11:02am Duration: 37 minutes Provider: Lawerance Wagner, Psy.D. Type of Session: Intake for Individual Therapy  Location of Patient: Home (private room) Location of Provider: Provider's home (private office) Type of Contact: Telepsychological Visit via MyChart Video Visit  Informed Consent: Prior to proceeding with today's appointment, two pieces of identifying information were obtained. In addition, Shannon Wagner's physical location at the time of this appointment was obtained as well a phone number she could be reached at in the event of technical difficulties. Shannon Wagner and this provider participated in today's telepsychological service.   The provider's role was explained to Shannon Wagner. The provider reviewed and discussed issues of confidentiality, privacy, and limits therein (e.g., reporting obligations). In addition to verbal informed consent, written informed consent for psychological services was obtained prior to the initial appointment. Since the clinic is not a 24/7 crisis center, mental health emergency resources were shared and this  provider explained MyChart, e-mail, voicemail, and/or other messaging systems should be utilized only for non-emergency reasons. This provider also explained that information obtained during appointments will be placed in Shannon Wagner's medical record and relevant information will be shared with other providers at [facility] for coordination of care. Shannon Wagner agreed information may be shared with other [facility] providers as needed for coordination of care and by signing the service agreement document, she provided written consent for coordination of care. Prior to initiating telepsychological services, Shannon Wagner completed an informed consent document, which included the development of a safety plan (i.e., an emergency contact and emergency  resources) in the event of an emergency/crisis. Shannon Wagner verbally acknowledged understanding she is ultimately responsible for understanding her insurance benefits for telepsychological and in-person services. This provider also reviewed confidentiality, as it relates to telepsychological services, as well as the rationale for telepsychological services (i.e., to reduce exposure risk to COVID-19). Shannon Wagner  acknowledged understanding that appointments cannot be recorded without both party consent and she is aware she is responsible for securing confidentiality on her end of the session. Shannon Wagner verbally consented to proceed.  Chief Complaint/HPI: Shannon Wagner was referred by Dr. Thomasene Wagner due to  mood disorder, with emotional eating . Per the note for the initial visit with Dr. Thomasene Wagner on April 19, 2021, "[name] is taking Prozac, and she notes depression and anxiety. She did see a counselor in the past, but not for a year or so. " The note for the initial appointment with Dr. Thomasene Wagner further indicated the following: " Her family eats meals together, she thinks her family will eat healthier with her, her desired weight loss is 61 lbs, she has been heavy most of her life, she started gaining weight in middle school, her heaviest weight ever was 237 pounds, she has significant food cravings issues, she snacks frequently in the evenings, she skips meals frequently, she is frequently drinking liquids with calories, she frequently makes poor food choices, she has problems with excessive hunger, she frequently eats larger portions than normal, she has binge eating behaviors, and she struggles with emotional eating" [name]'s Food and Mood (modified PHQ-9) score on April 19, 2021 was 7.  During today's appointment, Shannon Wagner shared she has "always" engaged in emotional eating behaviors, adding, "I never felt I had an eating disorder." She discussed experiencing "shame" surrounding eating and  "sneaking" food in childhood. She was verbally administered a questionnaire assessing various behaviors related to emotional  eating behaviors. [name] endorsed the following: overeat when you are celebrating, experience food cravings on a regular basis, eat certain foods when you are anxious, stressed, depressed, or your feelings are hurt, use food to help you cope with emotional situations, find food is comforting to you, overeat when you are worried about something, overeat frequently when you are bored or lonely, overeat when you are alone, but eat much less when you are with other people, and eat as a reward. She shared she craves "usually sweets, cookies, candies, ice cream." [name] described the current frequency of emotional eating behaviors as couple times a month, adding a reduction since starting Prozac approximately 6-8 months ago. In addition, Shannon Wagner denied a history of binge eating behaviors. Shannon Wagner also denied a history of purging and engagement in other compensatory strategies for weight loss. She discussed engaging in "different types of restrictive diets" over the years (e.g., carb restriction, calorie restriction). Currently, [name] indicated hormones triggers emotional eating behaviors, adding difficulty with "control" when celebrating. She stated reflecting on prior therapeutic services helps make emotional eating behaviors better. Furthermore, Shannon Wagner discussed "issues feeling motivated to exercise." She also disclosed a history of not wanting to be weighed at doctors' appointments as it contributed to anxiety.   Mental Status Examination:  Appearance: well groomed and appropriate hygiene  Behavior: appropriate to circumstances Mood: euthymic Affect: mood congruent Speech: normal in rate, volume, and tone Eye Contact: appropriate Psychomotor Activity: appropriate Gait: unable to assess  Thought Process: linear, logical, and goal directed  Thought Content/Perception: denies  suicidal and homicidal ideation, plan, and intent, no hallucinations, delusions, bizarre thinking or behavior reported or observed, and denies ideation and engagement in self-injurious behaviors Orientation: time, person, place, and purpose of appointment Memory/Concentration: memory, attention, language, and fund of knowledge intact  Insight/Judgment: good  Family & Psychosocial History: Shannon Wagner reported she is married and she has two children (ages 31 and 33). 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 Currently, Shannon Wagner's social support system consists of her husband, close cousin, and several co-workers. Moreover, Fynn stated she resides with her husband and children.   Medical History:  Past Medical History:  Diagnosis Date   Acquired hypothyroidism 04/10/2017   Anxiety    Back pain    Depression    Joint pain    Obesity (BMI 30-39.9) 04/10/2017   Other fatigue    SOB (shortness of breath) on exertion    Vitamin D deficiency    No past surgical history on file. Current Outpatient Medications on File Prior to Visit  Medication Sig Dispense Refill   ALPRAZolam (XANAX) 0.5 MG tablet Take 0.5 mg by mouth at bedtime as needed for anxiety.     Calcium-Phosphorus-Vitamin D (CALCIUM GUMMIES PO) Take by mouth.     Cholecalciferol (VITAMIN D3) 125 MCG (5000 UT) CAPS Take 1 capsule by mouth daily.     diphenhydrAMINE (BENADRYL) 25 MG tablet Take 25 mg by mouth every 6 (six) hours as needed for sleep.     FLUoxetine (PROZAC) 20 MG capsule Take 1 capsule (20 mg total) by mouth every morning. 90 capsule 1   ibuprofen (ADVIL,MOTRIN) 600 MG tablet Take 1 tablet (600 mg total) by mouth every 6 (six) hours. 30 tablet 0   levothyroxine (SYNTHROID) 175 MCG tablet Take 1 tablet (175 mcg total) by mouth daily before breakfast. 90 tablet 0   melatonin 3 MG TABS  tablet Take 3 mg by mouth at  bedtime.     metFORMIN (GLUCOPHAGE) 500 MG tablet Take 1 tablet (500 mg total) by mouth daily with lunch. 30 tablet 0   Multiple Vitamin (MULTI VITAMIN PO) Take by mouth.     Multiple Vitamins-Minerals (HAIR SKIN AND NAILS FORMULA PO) Take by mouth.     PARAGARD INTRAUTERINE COPPER IU by Intrauterine route.     Probiotic Product (RA PROBIOTIC GUMMIES PO) Take by mouth.     No current facility-administered medications on file prior to visit.  Medication compliant; no concerns.   Mental Health History: Shannon Wagner reported she first attended therapeutic services for grief around 2013 after her mother's passing. She also attended therapeutic services in the past 6 months with Shannon [facility]lingLisa [facility]) to process loss and to address symptoms of anxiety, adding their last appointment was likely at the end of Spring of this year. Shannon Wagner stated her PCP prescribed Prozac approximately 6 months ago. She indicated she was prescribed Xanax PRN a couple years ago by her PCP. Shannon Wagner reported there is no history of hospitalizations for psychiatric concerns. Shannon Wagner endorsed a family history of mental health related concerns. More specifically, she indicated there is a history of depression on her father's side of the family and her sister was previously hospitalized for psychiatric reasons. Shannon Wagner reported there is no history of trauma including psychological, physical , and sexual abuse, as well as neglect.   Shannon Wagner described her typical mood lately as "good." Shannon Wagner described her anxiety-related symptoms as generalized (e.g., every day tasks), adding it resulted in ruminating thoughts and sleep challenges. She described Prozac and therapeutic services helped with increasing her confidence. Shannon Wagner reported consuming 1-2 standard alcoholic drinks 2-3xs a week. She denied tobacco use. She denied illicit/recreational substance use. Regarding caffeine intake, Shannon Wagner reported consuming coffee  (20oz) and Diet Coke (12oz) daily. Furthermore, Shannon Wagner indicated she is not experiencing the following: hallucinations and delusions, paranoia, symptoms of mania , social withdrawal, crying spells, panic attacks, attention and concentration issues, and obsessions and compulsions. She also denied history of and current suicidal ideation, plan, and intent; history of and current homicidal ideation, plan, and intent; and history of and current engagement in self-harm.  The following strengths were reported by Shannon Wagner: organized, good at helping family, good at job, and caring. The following strengths were observed by this provider: ability to express thoughts and feelings during the therapeutic session, ability to establish and benefit from a therapeutic relationship, willingness to work toward established goal(s) with the clinic and ability to engage in reciprocal conversation.   Legal History: Shannon Wagner reported there is no history of legal involvement.   Structured Assessments Results: The Patient Health Questionnaire-9 (PHQ-9) is a self-report measure that assesses symptoms and severity of depression over the course of the last two weeks. Shannon Wagner obtained a score of 1 suggesting minimal depression. Shannon Wagner finds the endorsed symptoms to be not difficult at all. [0= Not at all; 1= Several days; 2= More than half the days; 3= Nearly every day] Little interest or pleasure in doing things 0  Feeling down, depressed, or hopeless 0  Trouble falling or staying asleep, or sleeping too much 0  Feeling tired or having little energy 1  Poor appetite or overeating 0  Feeling bad about yourself --- or that you are a failure or have let yourself or your family down 0  Trouble concentrating on things, such as reading the newspaper or watching television 0  Moving or  speaking so slowly that other people could have noticed? Or the opposite --- being so fidgety or restless that you have been moving around a Wagner  more than usual 0  Thoughts that you would be better off dead or hurting yourself in some way 0  PHQ-9 Score 1    The Generalized Anxiety Disorder-7 (GAD-7) is a brief self-report measure that assesses symptoms of anxiety over the course of the last two weeks. Shannon Wagner obtained a score of 0. [0= Not at all; 1= Several days; 2= Over half the days; 3= Nearly every day] Feeling nervous, anxious, on edge 0  Not being able to stop or control worrying 0  Worrying too much about different things 0  Trouble relaxing 0  Being so restless that it's hard to sit still 0  Becoming easily annoyed or irritable 0  Feeling afraid as if something awful might happen 0  GAD-7 Score 0   Interventions:  Conducted a chart review Focused on rapport building Verbally administered PHQ-9 and GAD-7 for symptom monitoring Verbally administered Food & Mood questionnaire to assess various behaviors related to emotional eating Provided emphatic reflections and validation Collaborated with patient on a treatment goal  Psychoeducation provided regarding physical versus emotional hunger  Provisional DSM-5 Diagnosis(es): F50.89 Other Specified Feeding or Eating Disorder, Emotional Eating Behaviors and F41.1 Generalized Anxiety Disorder  Plan: Shannon Wagner appears able and willing to participate as evidenced by collaboration on a treatment goal, engagement in reciprocal conversation, and asking questions as needed for clarification. The next appointment will be scheduled in approximately two weeks, which will be via MyChart Video Visit. The following treatment goal was established: increase coping skills. This provider will regularly review the treatment plan and medical chart to keep informed of status changes. Shannon Wagner expressed understanding and agreement with the initial treatment plan of care. Shannon Wagner will be sent a handout via e-mail to utilize between now and the next appointment to increase awareness of hunger patterns  and subsequent eating. Shannon Wagner provided verbal consent during today's appointment for this provider to send the handout via e-mail.

## 2021-04-23 NOTE — Progress Notes (Signed)
Chief Complaint:   OBESITY Shannon Wagner (MR# [id number]) is a 45 y.o. female who presents for evaluation and treatment of obesity and related comorbidities. Current BMI is Body mass index is 40.51 kg/m. Shannon Wagner has been struggling with her weight for many years and has been unsuccessful in either losing weight, maintaining weight loss, or reaching her healthy weight goal.  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 She lives with her husband [name] and her 2 sons ages 54 and 57. She eats 1 large portion and high calorie snacks (ice cream, cookies, candy) are her worst habits. She is a prior patient of Dr. Earlene Plater. She used Contrave in the past, and it worked well for several months years ago.  Shannon Wagner is currently in the action stage of change and ready to dedicate time achieving and maintaining a healthier weight. Shannon Wagner is interested in becoming our patient and working on intensive lifestyle modifications including (but not limited to) diet and exercise for weight loss.  Shannon Wagner's habits were reviewed today and are as follows: Her family eats meals together, she thinks her family will eat healthier with her, her desired weight loss is 61 lbs, she has been heavy most of her life, she started gaining weight in middle school, her heaviest weight ever was 237 pounds, she has significant food cravings issues, she snacks frequently in the evenings, she skips meals frequently, she is frequently drinking liquids with calories, she frequently makes poor food choices, she has problems with excessive hunger, she frequently eats larger portions than normal, she has binge eating behaviors, and she struggles with emotional eating.  Depression Screen [name]'s Food and Mood (modified PHQ-9) score was 7.  Depression screen PHQ 2/9 04/19/2021  Decreased Interest 1  Down, Depressed, Hopeless 1  PHQ - 2 Score 2  Altered sleeping 1  Tired, decreased energy 2  Change in appetite 1   Feeling bad or failure about yourself  0  Trouble concentrating 0  Moving slowly or fidgety/restless 1  Suicidal thoughts 0  PHQ-9 Score 7  Difficult doing work/chores Not difficult at all  Some encounter information is confidential and restricted. Go to Review Flowsheets activity to see all data.   Subjective:   1. Other fatigue [name] admits to daytime somnolence and admits to waking up still tired. Patent has a history of symptoms of daytime fatigue and morning headache. Shannon Wagner generally gets 6 or 7 hours of sleep per night, and states that she has nightime awakenings. Snoring is present. Apneic episodes are not present. Epworth Sleepiness Score is 6.  2. SOB (shortness of breath) on exertion Shannon Wagner notes increasing shortness of breath with exercising and seems to be worsening over time with weight gain. She notes getting out of breath sooner with activity than she used to. This has not gotten worse recently. Shannon Wagner denies shortness of breath at rest or orthopnea.  3. Other specified hypothyroidism Shannon Wagner is asymptomatic, and she denies a history of thyroid cancer.  4. Vitamin D deficiency Shannon Wagner is taking Vit D OTC 5,000 units daily.   5. Mood disorder (HCC) with emotional eating Shannon Wagner is taking Prozac, and she notes depression and anxiety. She did see a counselor in the past, but not for a year or so.   6. At risk for impaired metabolic function Shannon Wagner is at increased risk for impaired metabolic function due to current nutrition and muscle mass.  Assessment/Plan:   Orders Placed This Encounter  Procedures  Insulin, random   Hemoglobin A1c   Vitamin B12   VITAMIN D 25 Hydroxy (Vit-D Deficiency, Fractures)   Folate   EKG 12-Lead    Medications Discontinued During This Encounter  Medication Reason   cetirizine (ZYRTEC) 5 MG tablet Error   cyclobenzaprine (FLEXERIL) 10 MG tablet Error   Biotin w/ Vitamins C & E (HAIR/SKIN/NAILS PO) Error     No  orders of the defined types were placed in this encounter.    1. Other fatigue Shannon Wagner does feel that her weight is causing her energy to be lower than it should be. Fatigue may be related to obesity, depression or many other causes. Labs will be ordered, and in the meanwhile, [name] will focus on self care including making healthy food choices, increasing physical activity and focusing on stress reduction.  - Insulin, random - Hemoglobin A1c - Vitamin B12 - EKG 12-Lead  2. SOB (shortness of breath) on exertion Shannon Wagner does feel that she gets out of breath more easily that she used to when she exercises. Shannon Wagner's shortness of breath appears to be obesity related and exercise induced. She has agreed to work on weight loss and gradually increase exercise to treat her exercise induced shortness of breath. Will continue to monitor closely.  3. Other specified hypothyroidism Shannon Wagner's labs were stable < 3 months ago. We will check folate today. Orders and follow up as documented in patient record.  Counseling Good thyroid control is important for overall health. Supratherapeutic thyroid levels are dangerous and will not improve weight loss results. Counseling: The correct way to take levothyroxine is fasting, with water, separated by at least 30 minutes from breakfast, and separated by more than 4 hours from calcium, iron, multivitamins, acid reflux medications (PPIs).   - Folate  4. Vitamin D deficiency Low Vitamin D level contributes to fatigue and are associated with obesity, breast, and colon cancer. We will check labs today. Shannon Wagner will follow-up for routine testing of Vitamin D, at least 2-3 times per year to avoid over-replacement.  - VITAMIN D 25 Hydroxy (Vit-D Deficiency, Fractures)  5. Mood disorder (HCC) with emotional eating Behavior modification techniques were discussed today to help Kady deal with her emotional/non-hunger eating behaviors. Shannon Wagner will continue  Prozac, and she will start seeing Dr. Dewaine Conger, our Bariatric Psychologist for evaluation. Orders and follow up as documented in patient record.   6. At risk for impaired metabolic function Due to Shannon Wagner's current state of health and medical condition(s), she is at a significantly higher risk for impaired metabolic function.   At least 23 minutes was spent on counseling Meika about these concerns today.  This places the patient at a much greater risk to subsequently develop cardio-pulmonary conditions that can negatively affect the patient's quality of life.  I stressed the importance of reversing these risks factors.  The initial goal is to lose at least 5-10% of starting weight to help reduce risk factors.  Counseling:  Intensive lifestyle modifications discussed with Mckay as the most appropriate first line treatment.  she will continue to work on diet, exercise, and weight loss efforts.  We will continue to reassess these conditions on a fairly regular basis in an attempt to decrease the patient's overall morbidity and mortality.  7. Obesity with current BMI 40.6 Nya is currently in the action stage of change and her goal is to continue with weight loss efforts. I recommend Jamileth begin the structured treatment plan as follows:  She has agreed to the Category 3  Plan.  Exercise goals: As is.   Behavioral modification strategies: no skipping meals, keeping healthy foods in the home, and planning for success.  She was informed of the importance of frequent follow-up visits to maximize her success with intensive lifestyle modifications for her multiple health conditions. She was informed we would discuss her lab results at her next visit unless there is a critical issue that needs to be addressed sooner. Phillis agreed to keep her next visit at the agreed upon time to discuss these results.  Objective:   Blood pressure 108/72, pulse 67, temperature 98.2 F (36.8 C), height 5\' 4"   (1.626 m), weight 236 lb (107 kg), SpO2 98 %. Body mass index is 40.51 kg/m.  EKG: Normal sinus rhythm, rate 66 BPM.  Indirect Calorimeter completed today shows a VO2 of 333 and a REE of 2319.  Her calculated basal metabolic rate is thus her basal metabolic rate is better than expected.  General: Cooperative, alert, well developed, in no acute distress. HEENT: Conjunctivae and lids unremarkable. Cardiovascular: Regular rhythm.  Lungs: Normal work of breathing. Neurologic: No focal deficits.   Lab Results  Component Value Date   CREATININE 0.66 01/29/2021   BUN 12 01/29/2021   NA 138 01/29/2021   K 4.0 01/29/2021   CL 102 01/29/2021   CO2 23 01/29/2021   Lab Results  Component Value Date   ALT 13 01/29/2021   AST 14 01/29/2021   ALKPHOS 61 01/29/2021   BILITOT 0.6 01/29/2021   Lab Results  Component Value Date   HGBA1C 5.1 04/19/2021   Lab Results  Component Value Date   INSULIN 10.2 04/19/2021   Lab Results  Component Value Date   TSH 0.58 01/29/2021   Lab Results  Component Value Date   CHOL 166 01/29/2021   HDL 61.70 01/29/2021   LDLCALC 89 01/29/2021   TRIG 78.0 01/29/2021   CHOLHDL 3 01/29/2021   Lab Results  Component Value Date   WBC 8.6 01/29/2021   HGB 14.6 01/29/2021   HCT 43.2 01/29/2021   MCV 93.2 01/29/2021   PLT 329.0 01/29/2021   No results found for: IRON, TIBC, FERRITIN  Attestation Statements:   Reviewed by clinician on day of visit: allergies, medications, problem list, medical history, surgical history, family history, social history, and previous encounter notes.   01/31/2021, am acting as transcriptionist for Trude Mcburney, DO.  I have reviewed the above documentation for accuracy and completeness, and I agree with the above. [employer], D.O.  The 21st Century Cures Act was signed into law in 2016 which includes the topic of electronic health records.  This provides immediate access to information in  MyChart.  This includes consultation notes, operative notes, office notes, lab results and pathology reports.  If you have any questions about what you read please let 2017 know at your next visit so we can discuss your concerns and take corrective action if need be.  We are right here with you.

## 2021-05-03 ENCOUNTER — Other Ambulatory Visit: Payer: Self-pay

## 2021-05-03 ENCOUNTER — Encounter (INDEPENDENT_AMBULATORY_CARE_PROVIDER_SITE_OTHER): Payer: Self-pay | Admitting: Family Medicine

## 2021-05-03 ENCOUNTER — Other Ambulatory Visit (HOSPITAL_COMMUNITY): Payer: Self-pay

## 2021-05-03 ENCOUNTER — Ambulatory Visit (INDEPENDENT_AMBULATORY_CARE_PROVIDER_SITE_OTHER): Payer: No Typology Code available for payment source | Admitting: Family Medicine

## 2021-05-03 VITALS — HR 60 | Temp 98.0°F | Ht 64.0 in | Wt 234.0 lb

## 2021-05-03 DIAGNOSIS — E559 Vitamin D deficiency, unspecified: Secondary | ICD-10-CM | POA: Diagnosis not present

## 2021-05-03 DIAGNOSIS — E8881 Metabolic syndrome: Secondary | ICD-10-CM | POA: Diagnosis not present

## 2021-05-03 DIAGNOSIS — Z6841 Body Mass Index (BMI) 40.0 and over, adult: Secondary | ICD-10-CM

## 2021-05-03 DIAGNOSIS — Z9189 Other specified personal risk factors, not elsewhere classified: Secondary | ICD-10-CM | POA: Diagnosis not present

## 2021-05-03 DIAGNOSIS — E038 Other specified hypothyroidism: Secondary | ICD-10-CM | POA: Diagnosis not present

## 2021-05-03 MED ORDER — METFORMIN HCL 500 MG PO TABS
500.0000 mg | ORAL_TABLET | Freq: Every day | ORAL | 0 refills | Status: DC
  Filled 2021-05-03: qty 30, 30d supply, fill #0

## 2021-05-04 ENCOUNTER — Other Ambulatory Visit (HOSPITAL_COMMUNITY): Payer: Self-pay

## 2021-05-04 ENCOUNTER — Other Ambulatory Visit: Payer: Self-pay | Admitting: Family Medicine

## 2021-05-04 MED ORDER — LEVOTHYROXINE SODIUM 175 MCG PO TABS
175.0000 ug | ORAL_TABLET | Freq: Every day | ORAL | 0 refills | Status: DC
  Filled 2021-05-04: qty 90, 90d supply, fill #0

## 2021-05-07 ENCOUNTER — Telehealth (INDEPENDENT_AMBULATORY_CARE_PROVIDER_SITE_OTHER): Payer: No Typology Code available for payment source | Admitting: Psychology

## 2021-05-07 DIAGNOSIS — F5089 Other specified eating disorder: Secondary | ICD-10-CM | POA: Diagnosis not present

## 2021-05-07 DIAGNOSIS — F411 Generalized anxiety disorder: Secondary | ICD-10-CM | POA: Diagnosis not present

## 2021-05-07 NOTE — Progress Notes (Signed)
Chief Complaint:   OBESITY Shannon Wagner is here to discuss her progress with her obesity treatment plan along with follow-up of her obesity related diagnoses. Shannon Wagner is on the Category 3 Plan and states she is following her eating plan approximately 60% of the time. Shannon Wagner states she is not currently exercising.  Today's visit was #: 2 Starting weight: 236 lbs Starting date: 04/19/2021 Today's weight: 234 lbs Today's date: 05/03/2021 Total lbs lost to date: 2 Total lbs lost since last in-office visit: 2  Interim History: Shannon Wagner is here today for her first follow-up office visit since starting the program with Shannon Wagner.  All blood work/ lab tests that were recently ordered by myself or an outside provider were reviewed with patient today per their request.   Extended time was spent counseling her on all new disease processes that were discovered or preexisting ones that are affected by BMI.  she understands that many of these abnormalities will need to monitored regularly along with the current treatment plan of prudent dietary changes, in which we are making each and every office visit, to improve these health parameters. Shannon Wagner is here for a follow up office visit.  We reviewed her meal plan and questions were answered.  Patient's food recall appears to be accurate and consistent with what is on plan when she is following it.   When eating on plan, her hunger and cravings are well controlled.   After discussion of her meal plan, Shannon Wagner feels being more strict and following journal would be better for her.  Assessment/Plan:  No orders of the defined types were placed in this encounter.   There are no discontinued medications.   Meds ordered this encounter  Medications   metFORMIN (GLUCOPHAGE) 500 MG tablet    Sig: Take 1 tablet (500 mg total) by mouth daily with lunch.    Dispense:  30 tablet    Refill:  0     1. Insulin resistance New. Discussed labs with patient  today. At goal. Goal is HgbA1c < 5.7, fasting insulin closer to 5.  Medication: None. Shannon Wagner's fasting insulin level is 10.2. She was never told she was insulin resistant in the past. Pt has a lot of questions.    Plan: Start Metformin 500 mg with lunch after long discussion with pt regarding risks/benefits. She will continue to focus on protein-rich, low simple carbohydrate foods. We reviewed the importance of hydration, regular exercise for stress reduction, and restorative sleep.  Handout: Insulin Resistance; Metformin and education provided. Recheck in about 3 months with prudent nutritional plan and weight loss.  Lab Results  Component Value Date   HGBA1C 5.1 04/19/2021   Lab Results  Component Value Date   INSULIN 10.2 04/19/2021   Start- metFORMIN (GLUCOPHAGE) 500 MG tablet; Take 1 tablet (500 mg total) by mouth daily with lunch.  Dispense: 30 tablet; Refill: 0  2. Vitamin D deficiency Discussed labs with patient today. At goal.   Plan: Continue current OTC vitamin D 5,000 IU QD supplementation.  Follow-up for routine testing of Vitamin D, at least 2-3 times per year to avoid over-replacement.  Lab Results  Component Value Date   VD25OH 53.6 04/19/2021   VD25OH 29.04 (L) 09/28/2019   VD25OH 30.89 10/14/2017   3. Other specified hypothyroidism Discussed labs with patient today. TSH is within normal limits. Medication: Synthroid. Pt recently had labs with PCP 3 months ago. Pt is asymptomatic with no concerns.  Plan: Continue Synthroid  175 mcg QD, per PCP.   Lab Results  Component Value Date   TSH 0.58 01/29/2021   4. At risk for diabetes mellitus Shannon Wagner is at risk for diabetes due to new diagnosis on insulin resistance. Shannon Wagner was given diabetes prevention education and counseling today of more than 24 minutes.  - Counseled patient on pathophysiology of disease and meaning/ implication of lab results.  - Reviewed how certain foods can either stimulate or  inhibit insulin release, and subsequently affect hunger pathways  - Importance of following a healthy meal plan with limiting amounts of simple carbohydrates discussed with patient - Effects of regular aerobic exercise on blood sugar regulation reviewed and encouraged an eventual goal of 30 min 5d/week or more as a minimum.  - Briefly discussed treatment options, which always include dietary and lifestyle modification as first line.   - Handouts provided at patient's desire and/or told to go online to the American Diabetes Association website for further information.  5. Obesity with current BMI of 40.2  Shannon Wagner is currently in the action stage of change. As such, her goal is to continue with weight loss efforts. She has agreed to change to keeping a food journal and adhering to recommended goals of 1700-1800 calories and 120+ grams protein.   Bring in log of food journal totals per day for next OV.  Exercise goals:  As is  Behavioral modification strategies: increasing lean protein intake, decreasing simple carbohydrates, keeping healthy foods in the home, planning for success, and keeping a strict food journal.  [name] has agreed to follow-up with our clinic in 2-3 weeks. She was informed of the importance of frequent follow-up visits to maximize her success with intensive lifestyle modifications for her multiple health conditions.   Objective:   Pulse 60, temperature 98 F (36.7 C), height 5\' 4"  (1.626 m), weight 234 lb (106.1 kg), SpO2 99 %. Body mass index is 40.17 kg/m.  General: Cooperative, alert, well developed, in no acute distress. HEENT: Conjunctivae and lids unremarkable. Cardiovascular: Regular rhythm.  Lungs: Normal work of breathing. Neurologic: No focal deficits.   Lab Results  Component Value Date   CREATININE 0.66 01/29/2021   BUN 12 01/29/2021   NA 138 01/29/2021   K 4.0 01/29/2021   CL 102 01/29/2021   CO2 23 01/29/2021   Lab Results  Component Value  Date   ALT 13 01/29/2021   AST 14 01/29/2021   ALKPHOS 61 01/29/2021   BILITOT 0.6 01/29/2021   Lab Results  Component Value Date   HGBA1C 5.1 04/19/2021   Lab Results  Component Value Date   INSULIN 10.2 04/19/2021   Lab Results  Component Value Date   TSH 0.58 01/29/2021   Lab Results  Component Value Date   CHOL 166 01/29/2021   HDL 61.70 01/29/2021   LDLCALC 89 01/29/2021   TRIG 78.0 01/29/2021   CHOLHDL 3 01/29/2021   Lab Results  Component Value Date   VD25OH 53.6 04/19/2021   VD25OH 29.04 (L) 09/28/2019   VD25OH 30.89 10/14/2017   Lab Results  Component Value Date   WBC 8.6 01/29/2021   HGB 14.6 01/29/2021   HCT 43.2 01/29/2021   MCV 93.2 01/29/2021   PLT 329.0 01/29/2021   Attestation Statements:   Reviewed by clinician on day of visit: allergies, medications, problem list, medical history, surgical history, family history, social history, and previous encounter notes.  01/31/2021, CMA, am acting as transcriptionist for Edmund Hilda, DO.  I have  reviewed the above documentation for accuracy and completeness, and I agree with the above. Carlye Grippe, D.O.  The 21st Century Cures Act was signed into law in 2016 which includes the topic of electronic health records.  This provides immediate access to information in MyChart.  This includes consultation notes, operative notes, office notes, lab results and pathology reports.  If you have any questions about what you read please let Shannon Wagner know at your next visit so we can discuss your concerns and take corrective action if need be.  We are right here with you.

## 2021-05-08 NOTE — Progress Notes (Unsigned)
  Office: [phone]  /  Fax: [phone]    Date: May 22, 2021   Appointment Start Time: *** Duration: *** minutes Provider: Lawerance Cruel, Psy.D. Type of Session: Individual Therapy  Location of Patient: {gbptloc:23249} Location of Provider: Provider's Home (private office) Type of Contact: Telepsychological Visit via {MyChart Video Visit  Session Content: Shannon Wagner is a 45 y.o. female presenting for a follow-up appointment to address the previously established treatment goal of increasing coping skills. Today's appointment was a telepsychological visit due to COVID-19. [name] provided verbal consent for today's telepsychological appointment and she is aware she is responsible for securing confidentiality on her end of the session. Prior to proceeding with today's appointment, Elpidia's physical location at the time of this appointment was obtained as well a phone number she could be reached at in the event of technical difficulties. Jessie and this provider participated in today's telepsychological service.   This provider conducted a brief check-in. *** Raylin was receptive to today's appointment as evidenced by openness to sharing, responsiveness to feedback, and {gbreceptiveness:23401}.  Mental Status Examination:  Appearance: {Appearance:22431} Behavior: {Behavior:22445} Mood: {gbmood:21757} Affect: {Affect:22436} Speech: {Speech:22432} Eye Contact: {Eye Contact:22433} Psychomotor Activity: {Motor Activity:22434} Gait: {gbgait:23404} Thought Process: {thought process:22448}  Thought Content/Perception: {disturbances:22451} Orientation: {Orientation:22437} Memory/Concentration: {gbcognition:22449} Insight/Judgment: {Insight:22446}  Interventions:  {Interventions for Progress Notes:23405}  DSM-5 Diagnosis(es): F50.89 Other Specified Feeding or Eating Disorder, Emotional Eating Behaviors and F41.1 Generalized Anxiety Disorder  Treatment Goal & Progress: During  the initial appointment with this provider, the following treatment goal was established: increase coping skills. Kamara has demonstrated progress in her goal as evidenced by {gbtxprogress:22839}. Shi also {gbtxprogress2:22951}.  Plan: The next appointment will be scheduled in {gbweeks:21758}, which will be {gbtxmodality:23402}. The next session will focus on {Plan for Next Appointment:23400}.

## 2021-05-22 ENCOUNTER — Telehealth (INDEPENDENT_AMBULATORY_CARE_PROVIDER_SITE_OTHER): Payer: No Typology Code available for payment source | Admitting: Psychology

## 2021-05-31 ENCOUNTER — Other Ambulatory Visit: Payer: Self-pay

## 2021-05-31 ENCOUNTER — Encounter (INDEPENDENT_AMBULATORY_CARE_PROVIDER_SITE_OTHER): Payer: Self-pay | Admitting: Family Medicine

## 2021-05-31 ENCOUNTER — Other Ambulatory Visit (HOSPITAL_COMMUNITY): Payer: Self-pay

## 2021-05-31 ENCOUNTER — Ambulatory Visit (INDEPENDENT_AMBULATORY_CARE_PROVIDER_SITE_OTHER): Payer: No Typology Code available for payment source | Admitting: Family Medicine

## 2021-05-31 VITALS — BP 114/76 | HR 65 | Temp 97.5°F | Ht 64.0 in | Wt 227.0 lb

## 2021-05-31 DIAGNOSIS — Z6841 Body Mass Index (BMI) 40.0 and over, adult: Secondary | ICD-10-CM

## 2021-05-31 DIAGNOSIS — E8881 Metabolic syndrome: Secondary | ICD-10-CM | POA: Diagnosis not present

## 2021-05-31 DIAGNOSIS — Z9189 Other specified personal risk factors, not elsewhere classified: Secondary | ICD-10-CM

## 2021-05-31 MED ORDER — METFORMIN HCL 500 MG PO TABS
500.0000 mg | ORAL_TABLET | Freq: Every day | ORAL | 0 refills | Status: DC
  Filled 2021-05-31: qty 30, 30d supply, fill #0

## 2021-05-31 NOTE — Progress Notes (Signed)
Chief Complaint:   OBESITY Simona is here to discuss her progress with her obesity treatment plan along with follow-up of her obesity related diagnoses. Tranae is on keeping a food journal and adhering to recommended goals of 1700-1800 calories and 120+ grams of protein daily and states she is following her eating plan approximately 80% of the time. Devinne states she is doing 0 minutes 0 times per week.  Today's visit was #: 3 Starting weight: 236 lbs Starting date: 04/19/2021 Today's weight: 227 lbs Today's date: 05/31/2021 Total lbs lost to date: 9 Total lbs lost since last in-office visit: 7  Interim History: Glorian brought in journal, and she liked doing journaling and found it very enlightening. Half the days she hits her protein and calories goals. She finds her hunger and cravings much better controlled when she eats "on the plan" versus off the plan.  Subjective:   1. Insulin resistance Therese started metformin at her last office visit, and she is tolerating it well. She denies side effects.  2. At risk for osteoporosis Izetta is at higher risk of osteopenia and osteoporosis due to Vitamin D deficiency.   Assessment/Plan:   1. Insulin resistance Susannah will continue to work on weight loss, exercise, and decreasing simple carbohydrates to help decrease the risk of diabetes. We will refill metformin for 1 month, no change in dose for now. Brittny agreed to follow-up with [name] as directed to closely monitor her progress.  - metFORMIN (GLUCOPHAGE) 500 MG tablet; Take 1 tablet (500 mg total) by mouth daily with lunch.  Dispense: 30 tablet; Refill: 0  2. At risk for osteoporosis Derriana was given approximately 9 minutes of osteoporosis prevention counseling today. Candas is at risk for osteopenia and osteoporosis due to her Vitamin D deficiency. She was encouraged to take her Vitamin D and follow her higher calcium diet and increase strengthening exercise to  help strengthen her bones and decrease her risk of osteopenia and osteoporosis.  Repetitive spaced learning was employed today to elicit superior memory formation and behavioral change.  3. Obesity with current BMI of 39.0 Karrin is currently in the action stage of change. As such, her goal is to continue with weight loss efforts. She has agreed to keeping a food journal and adhering to recommended goals of 1700-1800 calories and 120+ grams of protein daily.   Exercise goals: All adults should avoid inactivity. Some physical activity is better than none, and adults who participate in any amount of physical activity gain some health benefits. Start walking some as desired  Behavioral modification strategies: planning for success and keeping a strict food journal.  Jadda has agreed to follow-up with our clinic in 2 to 3 weeks. She was informed of the importance of frequent follow-up visits to maximize her success with intensive lifestyle modifications for her multiple health conditions.   Objective:   Blood pressure 114/76, pulse 65, temperature (!) 97.5 F (36.4 C), height 5\' 4"  (1.626 m), weight 227 lb (103 kg), SpO2 98 %. Body mass index is 38.96 kg/m.  General: Cooperative, alert, well developed, in no acute distress. HEENT: Conjunctivae and lids unremarkable. Cardiovascular: Regular rhythm.  Lungs: Normal work of breathing. Neurologic: No focal deficits.   Lab Results  Component Value Date   CREATININE 0.66 01/29/2021   BUN 12 01/29/2021   NA 138 01/29/2021   K 4.0 01/29/2021   CL 102 01/29/2021   CO2 23 01/29/2021   Lab Results  Component Value Date  ALT 13 01/29/2021   AST 14 01/29/2021   ALKPHOS 61 01/29/2021   BILITOT 0.6 01/29/2021   Lab Results  Component Value Date   HGBA1C 5.1 04/19/2021   Lab Results  Component Value Date   INSULIN 10.2 04/19/2021   Lab Results  Component Value Date   TSH 0.58 01/29/2021   Lab Results  Component Value Date    CHOL 166 01/29/2021   HDL 61.70 01/29/2021   LDLCALC 89 01/29/2021   TRIG 78.0 01/29/2021   CHOLHDL 3 01/29/2021   Lab Results  Component Value Date   VD25OH 53.6 04/19/2021   VD25OH 29.04 (L) 09/28/2019   VD25OH 30.89 10/14/2017   Lab Results  Component Value Date   WBC 8.6 01/29/2021   HGB 14.6 01/29/2021   HCT 43.2 01/29/2021   MCV 93.2 01/29/2021   PLT 329.0 01/29/2021   No results found for: IRON, TIBC, FERRITIN  Attestation Statements:   Reviewed by clinician on day of visit: allergies, medications, problem list, medical history, surgical history, family history, social history, and previous encounter notes.   Trude Mcburney, am acting as transcriptionist for [employer], DO.  I have reviewed the above documentation for accuracy and completeness, and I agree with the above. Carlye Grippe, D.O.  The 21st Century Cures Act was signed into law in 2016 which includes the topic of electronic health records.  This provides immediate access to information in MyChart.  This includes consultation notes, operative notes, office notes, lab results and pathology reports.  If you have any questions about what you read please let us know at your next visit so we can discuss your concerns and take corrective action if need be.  We are right here with you.

## 2021-06-05 NOTE — Progress Notes (Signed)
  Office: [phone]  /  Fax: [phone]    Date: June 19, 2021   Appointment Start Time: 10:00am Duration: 26 minutes Provider: Lawerance Cruel, Psy.D. Type of Session: Individual Therapy  Location of Patient: Home (private location) Location of Provider: Provider's Home (private office) Type of Contact: Telepsychological Visit via MyChart Video Visit  Session Content: Shannon Wagner is a 45 y.o. female presenting for a follow-up appointment to address the previously established treatment goal of increasing coping skills.Today's appointment was a telepsychological visit due to COVID-19. [name] provided verbal consent for today's telepsychological appointment and she is aware she is responsible for securing confidentiality on her end of the session. Prior to proceeding with today's appointment, [name]'s physical location at the time of this appointment was obtained as well a phone number she could be reached at in the event of technical difficulties. Terre and this provider participated in today's telepsychological service.   This provider conducted a brief check-in. [name] reported, "Everything has been going really well." She discussed she continues to journal and is losing weight. She described feeling surprised she is consistently journaling and does not feel bad when deviations occur. Associated thoughts and feelings were processed. Psychoeducation provided regarding healthy weight loss. Associated thoughts and feelings were processed. Reviewed emotional and physical hunger. Remaining of session focused on strategies for navigating Halloween festivities. [name] was receptive to today's appointment as evidenced by openness to sharing, responsiveness to feedback, and willingness to implement discussed strategies .  Mental Status Examination:  Appearance: well groomed and appropriate hygiene  Behavior: appropriate to circumstances Mood: euthymic Affect: mood congruent Speech: normal  in rate, volume, and tone Eye Contact: appropriate Psychomotor Activity: appropriate Gait: unable to assess Thought Process: linear, logical, and goal directed  Thought Content/Perception: no hallucinations, delusions, bizarre thinking or behavior reported or observed and no evidence or endorsement of suicidal and homicidal ideation, plan, and intent Orientation: time, person, place, and purpose of appointment Memory/Concentration: memory, attention, language, and fund of knowledge intact  Insight/Judgment: good  Interventions:  Conducted a brief chart review Provided empathic reflections and validation Provided positive reinforcement Employed supportive psychotherapy interventions to facilitate reduced distress and to improve coping skills with identified stressors Psychoeducation provided regarding healthy weight loss  Strategies discussed for Halloween   DSM-5 Diagnosis(es): F50.89 Other Specified Feeding or Eating Disorder, Emotional Eating Behaviors and F41.1 Generalized Anxiety Disorder  Treatment Goal & Progress: During the initial appointment with this provider, the following treatment goal was established: increase coping skills. Shyne has demonstrated progress in her goal as evidenced by increased awareness of hunger patterns and reduction in emotional eating behaviors .   Plan: Based on progress to date per [name]'s self-report, the next appointment will be scheduled in 3-4 weeks, which will be via MyChart Video Visit. The next session will focus on working towards the established treatment goal.

## 2021-06-11 ENCOUNTER — Other Ambulatory Visit (HOSPITAL_COMMUNITY): Payer: Self-pay

## 2021-06-15 IMAGING — CT CT CERVICAL SPINE W/O CM
3 of 4 series · 13 of 33 positions shown, 16 images · non-contrast
Comparison: None.

CLINICAL DATA: Head trauma, intoxicated or tended.

EXAM:
CT HEAD WITHOUT CONTRAST
CT CERVICAL SPINE WITHOUT CONTRAST
TECHNIQUE: Multidetector CT imaging of the head and cervical spine was
performed following the standard protocol without intravenous
contrast. Multiplanar CT image reconstructions of the cervical spine
were also generated.

[Series 4: c_spine 2.0 st · axial · 0.29mm/px · z∈[+1290,+1428]mm · 5 of 105 slices shown, 7 images]
[im 18/105  soft-tissue]
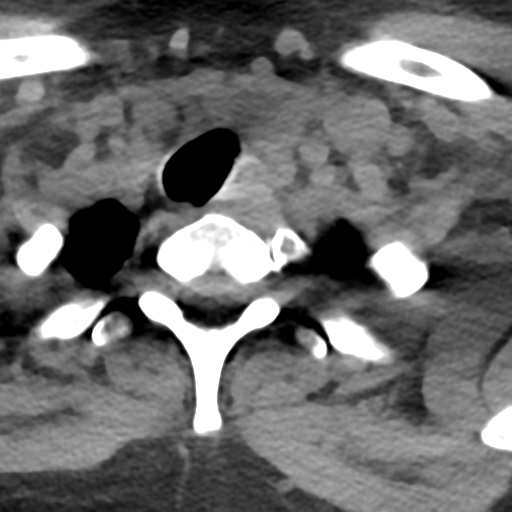
[im 18/105  bone]
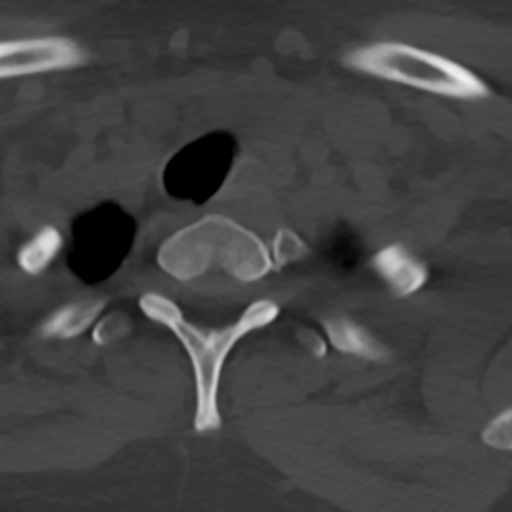
[im 35/105  bone]
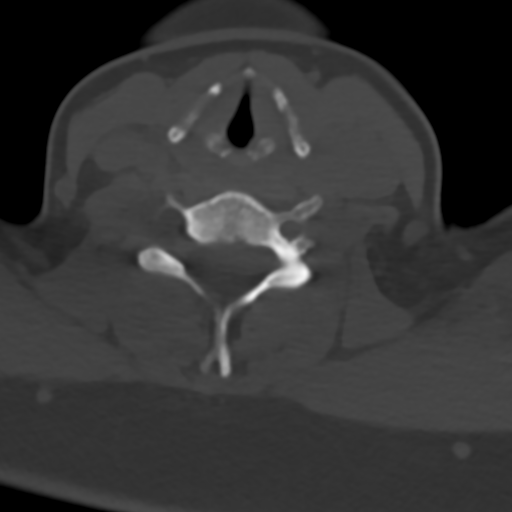
[im 53/105  bone]
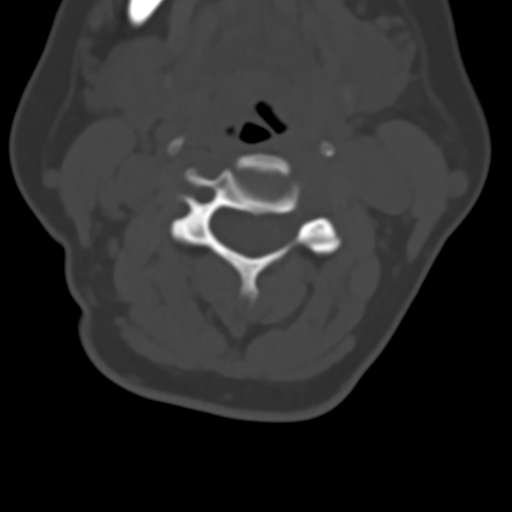
[im 70/105  bone]
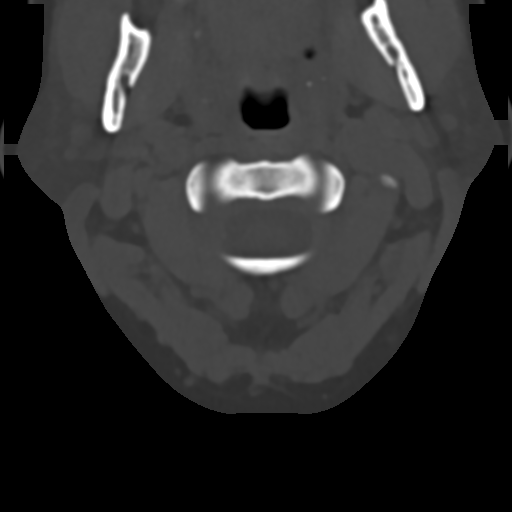
[im 87/105  soft-tissue]
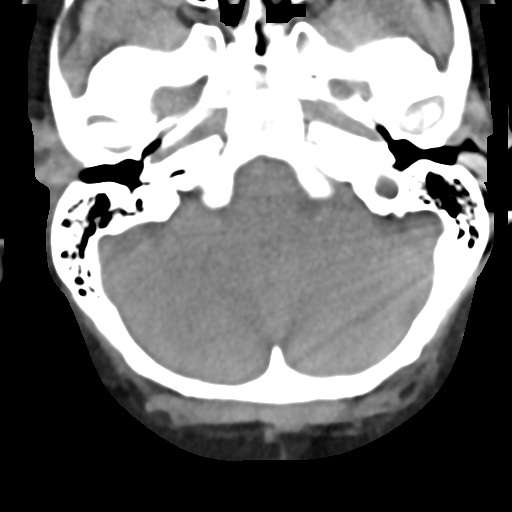
[im 87/105  bone]
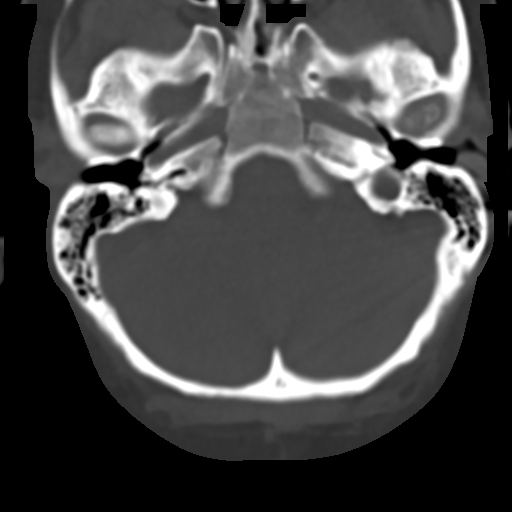

[Series 6: c_spine 2.0 sag bone · sagittal · 0.31mm/px · 5 of 63 slices shown, 6 images]
[im 21/63  bone]
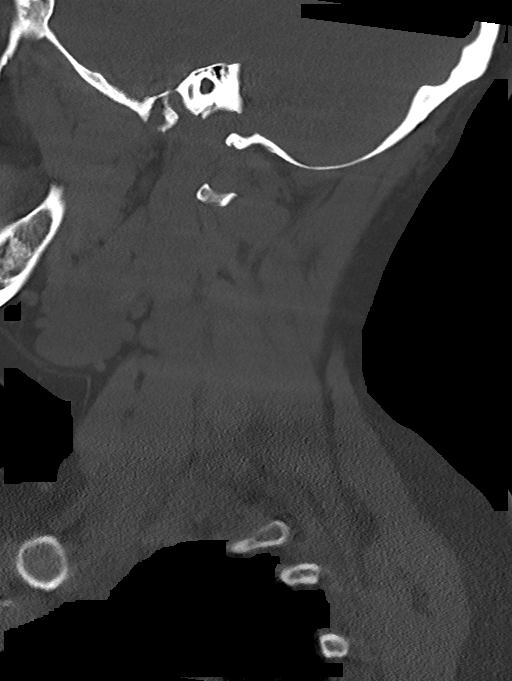
[im 26/63  bone]
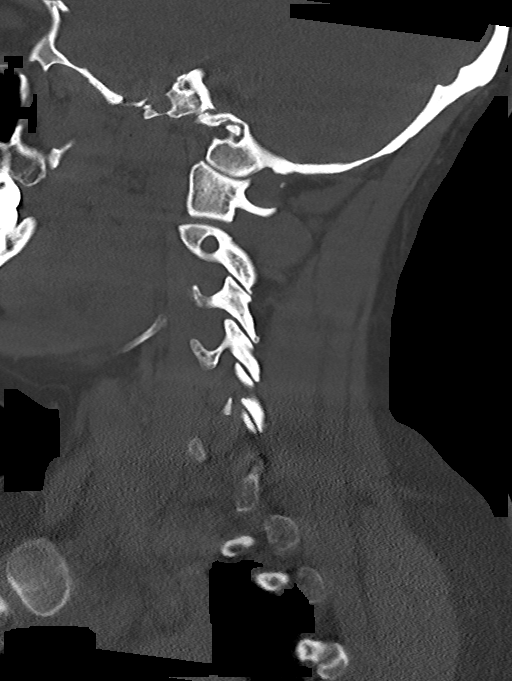
[im 32/63  soft-tissue]
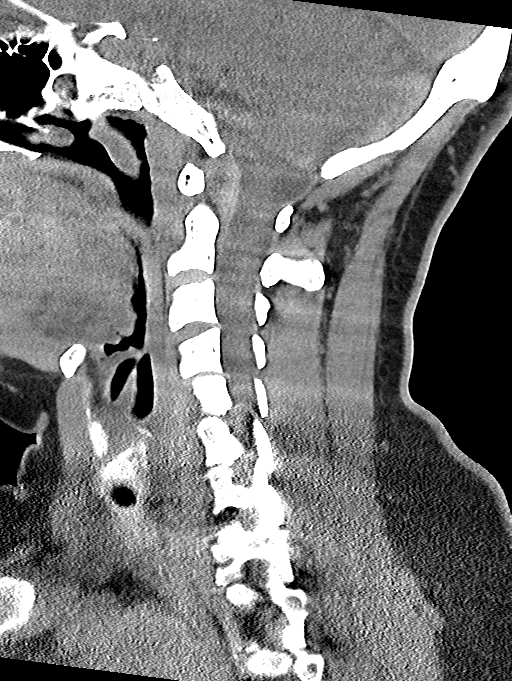
[im 32/63  bone]
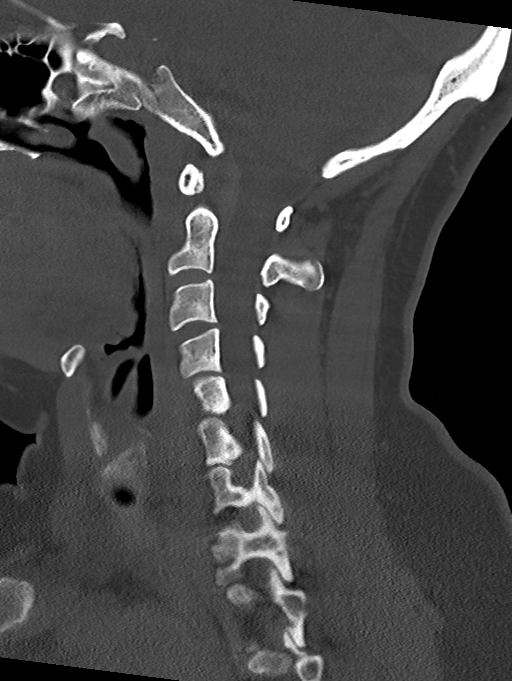
[im 37/63  bone]
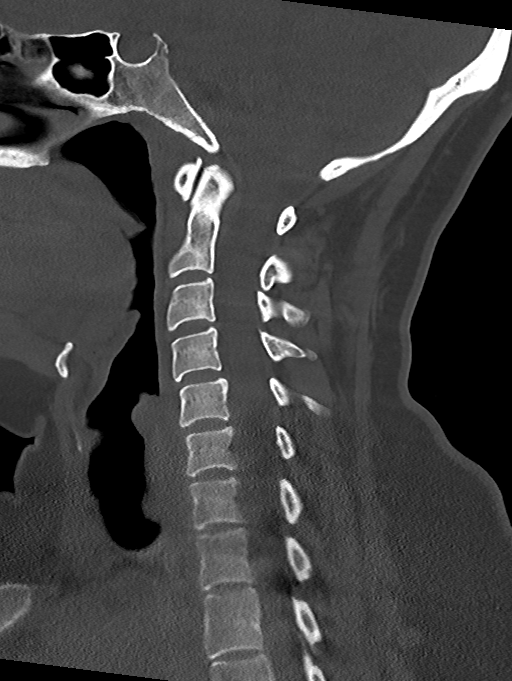
[im 42/63  bone]
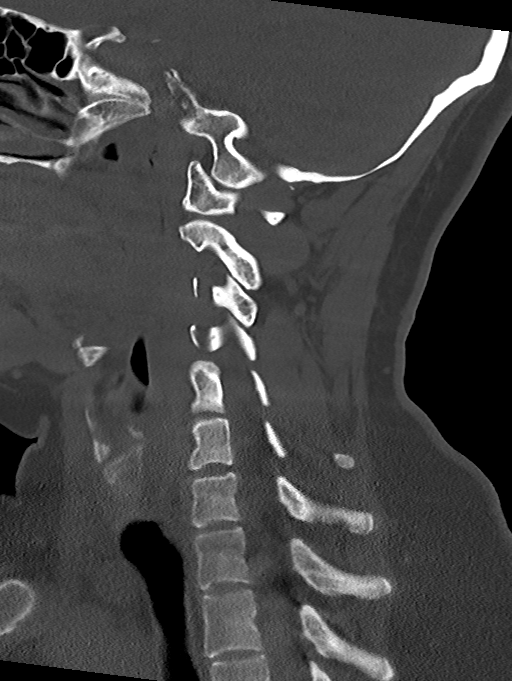

[Series 7: c_spine 2.0 cor bone · coronal · 0.31mm/px · 3 of 61 slices shown]
[im 13/61  bone]
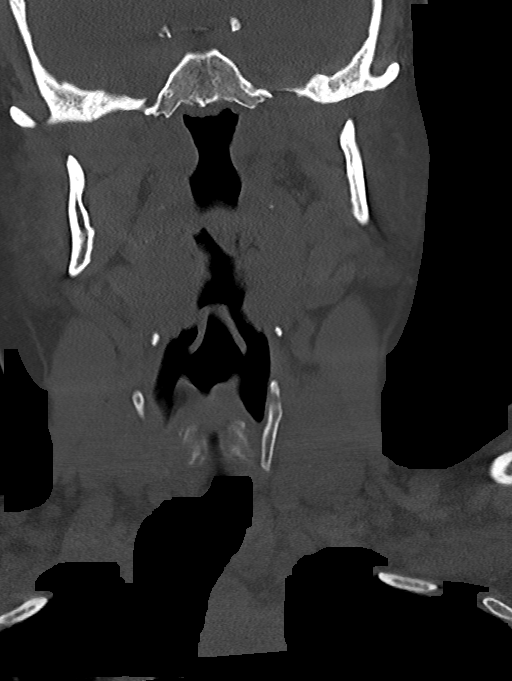
[im 25/61  bone]
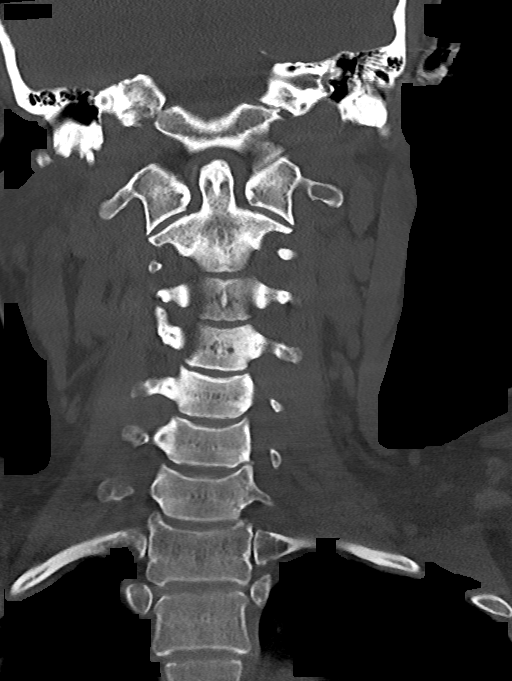
[im 37/61  bone]
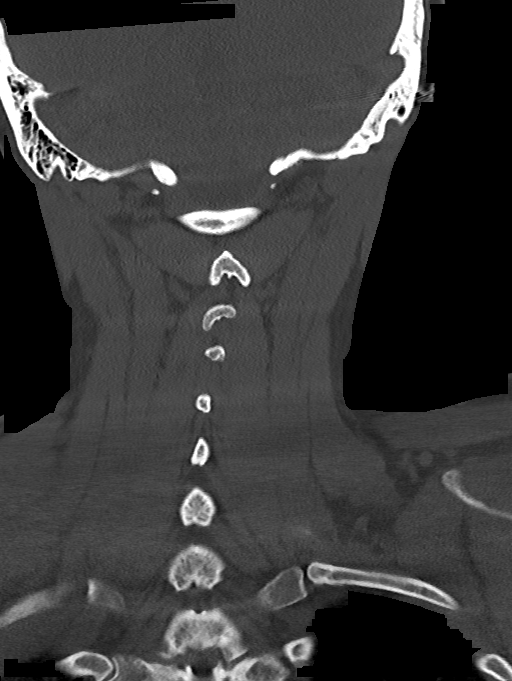

[13 of 33 positions shown; findings below may reference images not displayed]

FINDINGS: CT HEAD FINDINGS

Brain: No evidence of acute infarction, hemorrhage, hydrocephalus,
extra-axial collection or mass lesion/mass effect.

Vascular: No hyperdense vessel or unexpected calcification.

Skull: Normal. Negative for fracture or focal lesion.

Sinuses/Orbits: Mucous retention cyst in the left maxillary sinus.
Otherwise, the paranasal sinuses and mastoid air cells are
predominantly clear. Congenital under development of the left
frontal sinus.

Other: Cutaneous skin defect overlying the right frontal lateral
calvarium with adjacent subcutaneous edema and punctate foci of gas.

CT CERVICAL SPINE FINDINGS

Alignment: Likely positional straightening of the normal cervical
lordosis. No evidence of traumatic listhesis.

Skull base and vertebrae: No acute fracture. No primary bone lesion
or focal pathologic process.

Soft tissues and spinal canal: No prevertebral fluid or swelling. No
visible canal hematoma.

Disc levels:  Mild lower cervical disc space narrowing.

Upper chest: No acute abnormality.

Other: None
IMPRESSION: 1. No acute intracranial findings.
2. Cutaneous skin defect overlying the right frontolateral calvarium
with adjacent subcutaneous edema and punctate foci of gas. No
underlying osseous defect.
3. No evidence of acute fracture or traumatic listhesis of the
cervical spine.

## 2021-06-18 ENCOUNTER — Other Ambulatory Visit: Payer: Self-pay

## 2021-06-18 ENCOUNTER — Encounter (INDEPENDENT_AMBULATORY_CARE_PROVIDER_SITE_OTHER): Payer: Self-pay | Admitting: Family Medicine

## 2021-06-18 ENCOUNTER — Ambulatory Visit (INDEPENDENT_AMBULATORY_CARE_PROVIDER_SITE_OTHER): Payer: No Typology Code available for payment source | Admitting: Family Medicine

## 2021-06-18 ENCOUNTER — Other Ambulatory Visit (HOSPITAL_COMMUNITY): Payer: Self-pay

## 2021-06-18 VITALS — BP 114/73 | HR 65 | Temp 98.1°F | Ht 64.0 in | Wt 223.0 lb

## 2021-06-18 DIAGNOSIS — Z6841 Body Mass Index (BMI) 40.0 and over, adult: Secondary | ICD-10-CM

## 2021-06-18 DIAGNOSIS — F411 Generalized anxiety disorder: Secondary | ICD-10-CM | POA: Diagnosis not present

## 2021-06-18 DIAGNOSIS — E8881 Metabolic syndrome: Secondary | ICD-10-CM

## 2021-06-18 MED ORDER — METFORMIN HCL 500 MG PO TABS
500.0000 mg | ORAL_TABLET | Freq: Two times a day (BID) | ORAL | 0 refills | Status: DC
  Filled 2021-06-18 – 2021-06-28 (×2): qty 60, 30d supply, fill #0

## 2021-06-18 NOTE — Progress Notes (Signed)
Chief Complaint:   OBESITY Shannon Wagner is here to discuss her progress with her obesity treatment plan along with follow-up of her obesity related diagnoses. Shannon Wagner is on keeping a food journal and adhering to recommended goals of 1700-1800 calories and 120 grams of protein and states she is following her eating plan approximately 50% of the time. Shannon Wagner states she is doing 0 minutes 0 times per week.  Today's visit was #: 4 Starting weight: 236 lbs Starting date: 04/19/2021 Today's weight: 223 lbs Today's date: 06/18/2021 Total lbs lost to date: 13 lbs Total lbs lost since last in-office visit: 4 lbs  Interim History: Shannon Wagner is journaling consistently. She is low on protein - averaging 90-100 grams of per day. Her calories average 1400-1800 calories (around 1400 on busy work days).   Subjective:   1. Insulin resistance Shannon Wagner feels the Metformin helps with afternoon hunger. She is hungry breakfast between lunch and breakfast.  Lab Results  Component Value Date   INSULIN 10.2 04/19/2021   Lab Results  Component Value Date   HGBA1C 5.1 04/19/2021    2. Generalized anxiety disorder Shannon Wagner feels the Prozac is very helpful for her anxiety. Her anxiety is well controlled. She has seen Dr. Dewaine Conger one time and has an appointment tomorrow.  Assessment/Plan:   1. Insulin resistance Shannon Wagner agrees to increase dose of Metformin. We will refill Metformin 500 mg with meals 2 times daily with no refills.  - metFORMIN (GLUCOPHAGE) 500 MG tablet; Take 1 tablet (500 mg total) by mouth 2 (two) times daily with a meal.  Dispense: 60 tablet; Refill: 0  2. Generalized anxiety disorder  Shannon Wagner will continue Prozac. She will follow up with Dr. Dewaine Conger. Orders and follow up as documented in patient record.    3. Obesity with current BMI of 38.26 Shannon Wagner is currently in the action stage of change. As such, her goal is to continue with weight loss efforts. She has agreed to keeping a  food journal and adhering to recommended goals of 1700-1800 calories and 120 grams of protein daily.    Exercise goals: All adults should avoid inactivity. Some physical activity is better than none, and adults who participate in any amount of physical activity gain some health benefits.  Behavioral modification strategies: increasing lean protein intake.  Shannon Wagner has agreed to follow-up with our clinic in 2 weeks.  Objective:   Blood pressure 114/73, pulse 65, temperature 98.1 F (36.7 C), height 5\' 4"  (1.626 m), weight 223 lb (101.2 kg), SpO2 99 %. Body mass index is 38.28 kg/m.  General: Cooperative, alert, well developed, in no acute distress. HEENT: Conjunctivae and lids unremarkable. Cardiovascular: Regular rhythm.  Lungs: Normal work of breathing. Neurologic: No focal deficits.   Lab Results  Component Value Date   CREATININE 0.66 01/29/2021   BUN 12 01/29/2021   NA 138 01/29/2021   K 4.0 01/29/2021   CL 102 01/29/2021   CO2 23 01/29/2021   Lab Results  Component Value Date   ALT 13 01/29/2021   AST 14 01/29/2021   ALKPHOS 61 01/29/2021   BILITOT 0.6 01/29/2021   Lab Results  Component Value Date   HGBA1C 5.1 04/19/2021   Lab Results  Component Value Date   INSULIN 10.2 04/19/2021   Lab Results  Component Value Date   TSH 0.58 01/29/2021   Lab Results  Component Value Date   CHOL 166 01/29/2021   HDL 61.70 01/29/2021   LDLCALC 89 01/29/2021   TRIG 78.0  01/29/2021   CHOLHDL 3 01/29/2021   Lab Results  Component Value Date   VD25OH 53.6 04/19/2021   VD25OH 29.04 (L) 09/28/2019   VD25OH 30.89 10/14/2017   Lab Results  Component Value Date   WBC 8.6 01/29/2021   HGB 14.6 01/29/2021   HCT 43.2 01/29/2021   MCV 93.2 01/29/2021   PLT 329.0 01/29/2021   No results found for: IRON, TIBC, FERRITIN  Attestation Statements:   Reviewed by clinician on day of visit: allergies, medications, problem list, medical history, surgical history, family  history, social history, and previous encounter notes.   I, Jackson Latino, RMA, am acting as Energy manager for [employer], FNP.   I have reviewed the above documentation for accuracy and completeness, and I agree with the above. -  Jesse Sans, FNP

## 2021-06-19 ENCOUNTER — Telehealth (INDEPENDENT_AMBULATORY_CARE_PROVIDER_SITE_OTHER): Payer: No Typology Code available for payment source | Admitting: Psychology

## 2021-06-19 DIAGNOSIS — F411 Generalized anxiety disorder: Secondary | ICD-10-CM | POA: Diagnosis not present

## 2021-06-19 DIAGNOSIS — F5089 Other specified eating disorder: Secondary | ICD-10-CM | POA: Diagnosis not present

## 2021-06-19 DIAGNOSIS — E88819 Insulin resistance, unspecified: Secondary | ICD-10-CM | POA: Insufficient documentation

## 2021-06-19 DIAGNOSIS — E8881 Metabolic syndrome: Secondary | ICD-10-CM | POA: Insufficient documentation

## 2021-06-19 DIAGNOSIS — Z6841 Body Mass Index (BMI) 40.0 and over, adult: Secondary | ICD-10-CM | POA: Insufficient documentation

## 2021-06-28 ENCOUNTER — Other Ambulatory Visit (HOSPITAL_COMMUNITY): Payer: Self-pay

## 2021-07-02 ENCOUNTER — Encounter (INDEPENDENT_AMBULATORY_CARE_PROVIDER_SITE_OTHER): Payer: Self-pay

## 2021-07-02 NOTE — Progress Notes (Signed)
  Office: [phone]  /  Fax: [phone]    Date: July 16, 2021   Appointment Start Time: 10:02am Duration: 23 minutes Provider: Lawerance Cruel, Psy.D. Type of Session: Individual Therapy  Location of Patient: Home (private room) Location of Provider: Provider's Home (private office) Type of Contact: Telepsychological Visit via MyChart Video Visit/ Telephone Call  Session Content: This provider called [name] at 10:02am due to MyChart Video connectivity issues. Today's appointment was switched to a regular telephone call with Alizzon's verbal consent.   Danique is a 45 y.o. female presenting for a follow-up appointment to address the previously established treatment goal of increasing coping skills.Today's appointment was a telepsychological visit due to COVID-19. [name] provided verbal consent for today's telepsychological appointment and she is aware she is responsible for securing confidentiality on her end of the session. Prior to proceeding with today's appointment, Yexalen's physical location at the time of this appointment was obtained as well a phone number she could be reached at in the event of technical difficulties. Michaela and this provider participated in today's telepsychological service.   This provider conducted a brief check-in. Meaghan shared about recent events. She discussed enjoying "treats" during Halloween, adding she "tracked everything." Baleria reported experiencing cravings surrounding her menstrual cycle. Associated thoughts and feelings processed. She was encouraged to discuss further with [facility], FNP-C. To further assist with coping during her menstrual cycle, psychoeducation regarding the hunger and satisfaction scale was provided. [name] provided verbal consent during today's appointment for this provider to send a handout with the scale via e-mail. Overall, Annabeth was receptive to today's appointment as evidenced by openness to sharing,  responsiveness to feedback, and willingness to implement discussed strategies .  Mental Status Examination:  Appearance: unable to assess  Behavior: unable to assess Mood: euthymic Affect: unable to fully assess Speech: normal in rate, volume, and tone Eye Contact: unable to assess Psychomotor Activity: unable to assess  Gait: unable to assess Thought Process: linear, logical, and goal directed  Thought Content/Perception: no hallucinations, delusions, bizarre thinking or behavior reported or observed and no evidence or endorsement of suicidal and homicidal ideation, plan, and intent Orientation: time, person, place, and purpose of appointment Memory/Concentration: memory, attention, language, and fund of knowledge intact  Insight/Judgment: good  Interventions:  Conducted a brief chart review Provided empathic reflections and validation Reviewed content from the previous session Employed supportive psychotherapy interventions to facilitate reduced distress and to improve coping skills with identified stressors Psychoeducation provided regarding the hunger and satisfaction scale  DSM-5 Diagnosis(es): F50.89 Other Specified Feeding or Eating Disorder, Emotional Eating Behaviors and F41.1 Generalized Anxiety Disorder  Treatment Goal & Progress: During the initial appointment with this provider, the following treatment goal was established: increase coping skills. Joetta has demonstrated progress in her goal as evidenced by increased awareness of hunger patterns and reduction in emotional eating behaviors . Torina also continues to demonstrate willingness to engage in learned skill(s).  Plan: Per Jerzie's request, the next appointment will be scheduled in one month, which will be via MyChart Video Visit. The next session will focus on working towards the established treatment goal.

## 2021-07-03 ENCOUNTER — Ambulatory Visit (INDEPENDENT_AMBULATORY_CARE_PROVIDER_SITE_OTHER): Payer: No Typology Code available for payment source | Admitting: Family Medicine

## 2021-07-09 ENCOUNTER — Other Ambulatory Visit: Payer: Self-pay | Admitting: Family Medicine

## 2021-07-09 ENCOUNTER — Other Ambulatory Visit (HOSPITAL_COMMUNITY): Payer: Self-pay

## 2021-07-09 MED ORDER — LEVOTHYROXINE SODIUM 175 MCG PO TABS
175.0000 ug | ORAL_TABLET | Freq: Every day | ORAL | 0 refills | Status: DC
  Filled 2021-07-09 – 2021-08-12 (×2): qty 90, 90d supply, fill #0

## 2021-07-16 ENCOUNTER — Telehealth (INDEPENDENT_AMBULATORY_CARE_PROVIDER_SITE_OTHER): Payer: No Typology Code available for payment source | Admitting: Psychology

## 2021-07-16 DIAGNOSIS — F411 Generalized anxiety disorder: Secondary | ICD-10-CM | POA: Diagnosis not present

## 2021-07-16 DIAGNOSIS — F5089 Other specified eating disorder: Secondary | ICD-10-CM | POA: Diagnosis not present

## 2021-07-17 ENCOUNTER — Ambulatory Visit (INDEPENDENT_AMBULATORY_CARE_PROVIDER_SITE_OTHER): Payer: No Typology Code available for payment source | Admitting: Family Medicine

## 2021-07-17 ENCOUNTER — Encounter (INDEPENDENT_AMBULATORY_CARE_PROVIDER_SITE_OTHER): Payer: Self-pay | Admitting: Family Medicine

## 2021-07-17 ENCOUNTER — Other Ambulatory Visit: Payer: Self-pay

## 2021-07-17 ENCOUNTER — Other Ambulatory Visit (HOSPITAL_COMMUNITY): Payer: Self-pay

## 2021-07-17 VITALS — BP 114/79 | HR 74 | Temp 98.2°F | Ht 64.0 in | Wt 218.0 lb

## 2021-07-17 DIAGNOSIS — F411 Generalized anxiety disorder: Secondary | ICD-10-CM | POA: Diagnosis not present

## 2021-07-17 DIAGNOSIS — Z9189 Other specified personal risk factors, not elsewhere classified: Secondary | ICD-10-CM

## 2021-07-17 DIAGNOSIS — Z6841 Body Mass Index (BMI) 40.0 and over, adult: Secondary | ICD-10-CM | POA: Diagnosis not present

## 2021-07-17 DIAGNOSIS — E8881 Metabolic syndrome: Secondary | ICD-10-CM | POA: Diagnosis not present

## 2021-07-17 MED ORDER — METFORMIN HCL 500 MG PO TABS
500.0000 mg | ORAL_TABLET | Freq: Two times a day (BID) | ORAL | 0 refills | Status: DC
  Filled 2021-07-17: qty 60, 30d supply, fill #0

## 2021-07-17 MED ORDER — TIRZEPATIDE 2.5 MG/0.5ML ~~LOC~~ SOAJ
2.5000 mg | SUBCUTANEOUS | 0 refills | Status: DC
  Filled 2021-07-17: qty 2, 28d supply, fill #0

## 2021-07-17 MED ORDER — FLUOXETINE HCL 40 MG PO CAPS
40.0000 mg | ORAL_CAPSULE | Freq: Every day | ORAL | 0 refills | Status: DC
  Filled 2021-07-17: qty 90, 90d supply, fill #0

## 2021-07-17 NOTE — Progress Notes (Signed)
Chief Complaint:   OBESITY Shannon Wagner is here to discuss her progress with her obesity treatment plan along with follow-up of her obesity related diagnoses. Shannon Wagner is on keeping a food journal and adhering to recommended goals of 1700-1800 calories and 120 grams of protein and states she is following her eating plan approximately 75% of the time. Shannon Wagner states she is doing 0 minutes 0 times per week.  Today's visit was #: 5 Starting weight: 236 lbs Starting date: 04/19/2021 Today's weight: 218 lbs Today's date: 07/17/2021 Total lbs lost to date: 18 lbs Total lbs lost since last in-office visit: 5 lbs  Interim History: Shannon Wagner is journaling very consistently. She has journaled 74 straight days! She is very happy with her progress. She averages 90-110 grams of protein per day. She occasionally exceeds calories, especially days before her period.   Subjective:   1. Insulin resistance Shannon Wagner notes polyphagia and cravings at times.   Lab Results  Component Value Date   INSULIN 10.2 04/19/2021   Lab Results  Component Value Date   HGBA1C 5.1 04/19/2021    2. Generalized anxiety disorder Nou notes increased cravings about 4 days before her period. She also feels that her anxiety is not as well controlled as she would like.  3. At risk for side effect of medication Shannon Wagner is at risk for side effect of medication due to start of Mounjaro.   Assessment/Plan:   1. Insulin resistance  She agrees to start Mounjaro 2.5 mg weekly.  Shannon Wagner denies  history of cholelithiasis, personal family history of thyroid cancer, or pancreatitis.  D/C metformin. - tirzepatide [facility]) 2.5 MG/0.5ML Pen; Inject 2.5 mg into the skin once a week.  Dispense: 2 mL; Refill: 0  2. Generalized anxiety disorder Shannon Wagner agrees to increase dose of Prozac 40 mg daily. She will wait to start until 1 week after start of Mounjaro in order to be able to identify source of side effects if they  occur.  - FLUoxetine (PROZAC) 40 MG capsule; Take 1 capsule (40 mg total) by mouth daily.  Dispense: 90 capsule; Refill: 0  3. At risk for side effect of medication Shannon Wagner was given approximately 15 minutes of drug side effect counseling today.  We discussed side effect possibility and risk versus benefits. Shannon Wagner agreed to the medication and will contact this office if these side effects are intolerable.  Repetitive spaced learning was employed today to elicit superior memory formation and behavioral change.   4. Obesity with current BMI of 37.4 Shannon Wagner is currently in the action stage of change. As such, her goal is to continue with weight loss efforts. She has agreed to keeping a food journal and adhering to recommended goals of 1700-1800 calories and 90-120 grams of protein daily.   Handouts: Eating out and Holiday Strategies.  Exercise goals: No exercise has been prescribed at this time.  Behavioral modification strategies: planning for success.  Shannon Wagner has agreed to follow-up with our clinic in 3-4 weeks.  Objective:   Blood pressure 114/79, pulse 74, temperature 98.2 F (36.8 C), height 5\' 4"  (1.626 m), weight 218 lb (98.9 kg), SpO2 95 %. Body mass index is 37.42 kg/m.  General: Cooperative, alert, well developed, in no acute distress. HEENT: Conjunctivae and lids unremarkable. Cardiovascular: Regular rhythm.  Lungs: Normal work of breathing. Neurologic: No focal deficits.   Lab Results  Component Value Date   CREATININE 0.66 01/29/2021   BUN 12 01/29/2021   NA 138 01/29/2021   K  4.0 01/29/2021   CL 102 01/29/2021   CO2 23 01/29/2021   Lab Results  Component Value Date   ALT 13 01/29/2021   AST 14 01/29/2021   ALKPHOS 61 01/29/2021   BILITOT 0.6 01/29/2021   Lab Results  Component Value Date   HGBA1C 5.1 04/19/2021   Lab Results  Component Value Date   INSULIN 10.2 04/19/2021   Lab Results  Component Value Date   TSH 0.58 01/29/2021   Lab  Results  Component Value Date   CHOL 166 01/29/2021   HDL 61.70 01/29/2021   LDLCALC 89 01/29/2021   TRIG 78.0 01/29/2021   CHOLHDL 3 01/29/2021   Lab Results  Component Value Date   VD25OH 53.6 04/19/2021   VD25OH 29.04 (L) 09/28/2019   VD25OH 30.89 10/14/2017   Lab Results  Component Value Date   WBC 8.6 01/29/2021   HGB 14.6 01/29/2021   HCT 43.2 01/29/2021   MCV 93.2 01/29/2021   PLT 329.0 01/29/2021   No results found for: IRON, TIBC, FERRITIN  Attestation Statements:   Reviewed by clinician on day of visit: allergies, medications, problem list, medical history, surgical history, family history, social history, and previous encounter notes.  I, Jackson Latino, RMA, am acting as Energy manager for [place], FNP.   I have reviewed the above documentation for accuracy and completeness, and I agree with the above. -  Jesse Sans, FNP

## 2021-07-18 ENCOUNTER — Other Ambulatory Visit (HOSPITAL_COMMUNITY): Payer: Self-pay

## 2021-07-18 ENCOUNTER — Telehealth (INDEPENDENT_AMBULATORY_CARE_PROVIDER_SITE_OTHER): Payer: Self-pay

## 2021-07-18 NOTE — Telephone Encounter (Signed)
Need PA for Mounjaro 

## 2021-07-19 ENCOUNTER — Encounter (INDEPENDENT_AMBULATORY_CARE_PROVIDER_SITE_OTHER): Payer: Self-pay

## 2021-07-19 NOTE — Telephone Encounter (Signed)
Prior authorization is not required for [facility] per insurance.

## 2021-07-30 NOTE — Progress Notes (Signed)
  Office: [phone]  /  Fax: [phone]    Date: August 13, 2021   Appointment Start Time: 10:02am Duration: 29 minutes Provider: Lawerance Cruel, Psy.D. Type of Session: Individual Therapy  Location of Patient: Home (private location) Location of Provider: Provider's Home (private office) Type of Contact: Telepsychological Visit via MyChart Video Visit  Session Content: Shannon Wagner is a 45 y.o. female presenting for a follow-up appointment to address the previously established treatment goal of increasing coping skills.Today's appointment was a telepsychological visit due to COVID-19. [name] provided verbal consent for today's telepsychological appointment and she is aware she is responsible for securing confidentiality on her end of the session. Prior to proceeding with today's appointment, [name]'s physical location at the time of this appointment was obtained as well a phone number she could be reached at in the event of technical difficulties. [name] and this provider participated in today's telepsychological service.   This provider conducted a brief check-in. [name] shared she has been taking Mounjaro for the past four weeks, adding, "It's been really working." She expressed concern about its availability moving forward. Thus, this provider and [name] discussed what else has helped her make progress with weight loss aside from the medication. Also, discussed how these changes have fit her "anti diet" mindset and their impact moving forward. Furthermore, termination planning was discussed due to progress to date. [name] was receptive to a follow-up/termination appointment in 3-4 weeks. Overall, [name] was receptive to today's appointment as evidenced by openness to sharing, responsiveness to feedback, and willingness to continue engaging in learned skills.  Mental Status Examination:  Appearance: well groomed and appropriate hygiene  Behavior: appropriate to  circumstances Mood: neutral Affect: mood congruent Speech: normal in rate, volume, and tone Eye Contact: appropriate Psychomotor Activity: WNL Gait: unable to assess Thought Process: linear, logical, and goal directed and no endorsement or observation of suicidal, homicidal, and self-harm ideation, plan and intent  Thought Content/Perception: no hallucinations, delusions, bizarre thinking or behavior endorsed or observed Orientation: AAOx4 Memory/Concentration: memory, attention, language, and fund of knowledge intact  Insight: good Judgment: good  Interventions:  Conducted a brief chart review Provided empathic reflections and validation Employed supportive psychotherapy interventions to facilitate reduced distress and to improve coping skills with identified stressors Employed insight oriented and cognitive psychotherapy interventions to identify and modify anxiety/mood producing thoughts, beliefs, and negative self-appraisals contributing to distress and emotional eating Discussed termination planning  DSM-5 Diagnosis(es): F50.89 Other Specified Feeding or Eating Disorder, Emotional Eating Behaviors and F41.1 Generalized Anxiety Disorder  Treatment Goal & Progress: During the initial appointment with this provider, the following treatment goal was established: increase coping skills. [name] has demonstrated progress in her goal as evidenced by increased awareness of hunger patterns, increased awareness of triggers for emotional eating behaviors, and reduction in emotional eating behaviors . [name] also continues to demonstrate willingness to engage in learned skill(s).  Plan: The next appointment will be scheduled in approximately 3-4 weeks, which will be via MyChart Video Visit. The next session will focus on working towards the established treatment goal and termination.

## 2021-08-13 ENCOUNTER — Telehealth (INDEPENDENT_AMBULATORY_CARE_PROVIDER_SITE_OTHER): Payer: No Typology Code available for payment source | Admitting: Psychology

## 2021-08-13 ENCOUNTER — Other Ambulatory Visit (HOSPITAL_COMMUNITY): Payer: Self-pay

## 2021-08-13 DIAGNOSIS — F5089 Other specified eating disorder: Secondary | ICD-10-CM

## 2021-08-13 DIAGNOSIS — F411 Generalized anxiety disorder: Secondary | ICD-10-CM | POA: Diagnosis not present

## 2021-08-14 ENCOUNTER — Other Ambulatory Visit (HOSPITAL_COMMUNITY): Payer: Self-pay

## 2021-08-14 ENCOUNTER — Ambulatory Visit (INDEPENDENT_AMBULATORY_CARE_PROVIDER_SITE_OTHER): Payer: No Typology Code available for payment source | Admitting: Family Medicine

## 2021-08-14 ENCOUNTER — Other Ambulatory Visit: Payer: Self-pay

## 2021-08-14 ENCOUNTER — Encounter (INDEPENDENT_AMBULATORY_CARE_PROVIDER_SITE_OTHER): Payer: Self-pay | Admitting: Family Medicine

## 2021-08-14 VITALS — BP 99/66 | HR 67 | Temp 98.5°F | Ht 64.0 in | Wt 211.0 lb

## 2021-08-14 DIAGNOSIS — Z6841 Body Mass Index (BMI) 40.0 and over, adult: Secondary | ICD-10-CM | POA: Diagnosis not present

## 2021-08-14 DIAGNOSIS — E8881 Metabolic syndrome: Secondary | ICD-10-CM

## 2021-08-14 DIAGNOSIS — F5089 Other specified eating disorder: Secondary | ICD-10-CM | POA: Diagnosis not present

## 2021-08-14 DIAGNOSIS — E88819 Insulin resistance, unspecified: Secondary | ICD-10-CM

## 2021-08-14 MED ORDER — TIRZEPATIDE 2.5 MG/0.5ML ~~LOC~~ SOAJ
2.5000 mg | SUBCUTANEOUS | 0 refills | Status: DC
Start: 2021-08-14 — End: 2021-08-30
  Filled 2021-08-14: qty 2, 28d supply, fill #0

## 2021-08-14 NOTE — Progress Notes (Signed)
Chief Complaint:   OBESITY Shannon Wagner is here to discuss her progress with her obesity treatment plan along with follow-up of her obesity related diagnoses. Shannon Wagner is on keeping a food journal and adhering to recommended goals of 1700-1800 calories and 90-120 grams of protein and states she is following her eating plan approximately 100% of the time. Shannon Wagner states she is doing 0 minutes 0 times per week.  Today's visit was #: 6 Starting weight: 236 lbs Starting date: 04/19/2021 Today's weight: 211 lbs Today's date: 08/14/2021 Total lbs lost to date: 25 lbs Total lbs lost since last in-office visit: 7 lbs  Interim History: Shannon Wagner is journaling consistently. She says she did well over Thanksgiving and had small portions.. She is meeting protein and calorie goals for the most part. Her water intake is good.  She is very happy with her progress with weight loss.  Subjective:   1. Insulin resistance Shannon Wagner was started on Mounjaro 2.5 mg at last office visit. She denies side effects and constipation. Shannon Wagner states it is helping with appetite and has decreased her snacking.  Lab Results  Component Value Date   INSULIN 10.2 04/19/2021   Lab Results  Component Value Date   HGBA1C 5.1 04/19/2021    2. Other Specified Feeding or Eating Disorder, Emotional Eating Behaviors  We increased Shannon Wagner's Prozac to 40 mg a last office visit. She feels it is helping with pre-menstrual cravings. Her mood is stable. She is seeing Dr. Dewaine Conger and has a follow up on 09-11-2021.  Assessment/Plan:   1. Insulin resistance  We will refill Mounjaro 2.5 mg weekly (same dose). Shannon Wagner agreed to follow-up with [name] as directed to closely monitor her progress.  - tirzepatide [facility]) 2.5 MG/0.5ML Pen; Inject 2.5 mg into the skin once a week.  Dispense: 2 mL; Refill: 0  2. Other Specified Feeding or Eating Disorder, Emotional Eating Behaviors  Shannon Wagner will continue Prozac 40 mg daily.   3.  Obesity with current BMI of 36.2 Shannon Wagner is currently in the action stage of change. As such, her goal is to continue with weight loss efforts. She has agreed to keeping a food journal and adhering to recommended goals of 1700-1800 calories and 90-120 grams of protein daily.  Exercise goals: No exercise has been prescribed at this time.  Behavioral modification strategies: planning for success.  Shannon Wagner has agreed to follow-up with our clinic in 4 weeks. Objective:   Blood pressure 99/66, pulse 67, temperature 98.5 F (36.9 C), height 5\' 4"  (1.626 m), weight 211 lb (95.7 kg), SpO2 100 %. Body mass index is 36.22 kg/m.  General: Cooperative, alert, well developed, in no acute distress. HEENT: Conjunctivae and lids unremarkable. Cardiovascular: Regular rhythm.  Lungs: Normal work of breathing. Neurologic: No focal deficits.   Lab Results  Component Value Date   CREATININE 0.66 01/29/2021   BUN 12 01/29/2021   NA 138 01/29/2021   K 4.0 01/29/2021   CL 102 01/29/2021   CO2 23 01/29/2021   Lab Results  Component Value Date   ALT 13 01/29/2021   AST 14 01/29/2021   ALKPHOS 61 01/29/2021   BILITOT 0.6 01/29/2021   Lab Results  Component Value Date   HGBA1C 5.1 04/19/2021   Lab Results  Component Value Date   INSULIN 10.2 04/19/2021   Lab Results  Component Value Date   TSH 0.58 01/29/2021   Lab Results  Component Value Date   CHOL 166 01/29/2021   HDL 61.70 01/29/2021  LDLCALC 89 01/29/2021   TRIG 78.0 01/29/2021   CHOLHDL 3 01/29/2021   Lab Results  Component Value Date   VD25OH 53.6 04/19/2021   VD25OH 29.04 (L) 09/28/2019   VD25OH 30.89 10/14/2017   Lab Results  Component Value Date   WBC 8.6 01/29/2021   HGB 14.6 01/29/2021   HCT 43.2 01/29/2021   MCV 93.2 01/29/2021   PLT 329.0 01/29/2021   No results found for: IRON, TIBC, FERRITIN  Attestation Statements:   Reviewed by clinician on day of visit: allergies, medications, problem list,  medical history, surgical history, family history, social history, and previous encounter notes.  I, Jackson Latino, RMA, am acting as Energy manager for [place], FNP.   I have reviewed the above documentation for accuracy and completeness, and I agree with the above. -  Jesse Sans, FNP

## 2021-08-28 NOTE — Progress Notes (Signed)
°  Office: [phone]  /  Fax: [phone]    Date: September 11, 2021   Appointment Start Time: 8:36am Duration: 25 minutes Provider: Lawerance Cruel, Psy.D. Type of Session: Individual Therapy  Location of Patient: Home (private location) Location of Provider: Provider's Home (private office) Type of Contact: Telepsychological Visit via MyChart Video Visit  Session Content: Shannon Wagner is a 45 y.o. female presenting for a follow-up appointment to address the previously established treatment goal of increasing coping skills.Today's appointment was a telepsychological visit due to COVID-19. Shannon Wagner provided verbal consent for today's telepsychological appointment and she is aware she is responsible for securing confidentiality on her end of the session. Prior to proceeding with today's appointment, Shannon Wagner's physical location at the time of this appointment was obtained as well a phone number she could be reached at in the event of technical difficulties. Shannon Wagner and this provider participated in today's telepsychological service.   This provider conducted a brief check-in. Shannon Wagner shared she is doing "really good," adding she "survived the holidays." She acknowledged she did not journal what she was eating between Christmas and New Year, noting she continued to make better choices and engage in portion control. Positive reinforcement was provided. Shannon Wagner continues to express concern about her ability to continue to be successful with this lifestyle change when she ceases Mounjaro. Further explored and processed. Moreover, psychoeducation provided regarding self-compassion. Shannon Wagner was engaged in a self-compassion exercise to help with eating-related challenges and other ongoing stressors. She was encouraged to regularly ask, What do I need right now?" Overall, Shannon Wagner was receptive to today's appointment as evidenced by openness to sharing, responsiveness to feedback, and willingness to implement  discussed strategies .  Mental Status Examination:  Appearance: well groomed and appropriate hygiene  Behavior: appropriate to circumstances Mood: neutral Affect: mood congruent Speech: WNL Eye Contact: appropriate Psychomotor Activity: WNL Gait: unable to assess Thought Process: linear, logical, and goal directed and no evidence or endorsement of suicidal, homicidal, and self-harm ideation, plan and intent  Thought Content/Perception: no hallucinations, delusions, bizarre thinking or behavior endorsed or observed Orientation: AAOx4 Memory/Concentration: memory, attention, language, and fund of knowledge intact  Insight/Judgment: good  Interventions:  Conducted a brief chart review Provided empathic reflections and validation Provided positive reinforcement Employed supportive psychotherapy interventions to facilitate reduced distress and to improve coping skills with identified stressors Psychoeducation provided regarding self-compassion Engaged pt in a self-compassion exercise  DSM-5 Diagnosis(es):  F50.89 Other Specified Feeding or Eating Disorder, Emotional Eating Behaviors and F41.1 Generalized Anxiety Disorder  Treatment Goal & Progress: During the initial appointment with this provider, the following treatment goal was established: increase coping skills. Shannon Wagner has demonstrated progress in her goal as evidenced by increased awareness of hunger patterns and reduction in emotional eating behaviors . Shannon Wagner also continues to demonstrate willingness to engage in learned skill(s).  Plan: The next appointment will be scheduled in 3-4 weeks, which will be via MyChart Video Visit. The next session will focus on working towards the established treatment goal and termination.

## 2021-08-30 ENCOUNTER — Other Ambulatory Visit (INDEPENDENT_AMBULATORY_CARE_PROVIDER_SITE_OTHER): Payer: Self-pay | Admitting: Family Medicine

## 2021-08-30 ENCOUNTER — Other Ambulatory Visit (HOSPITAL_COMMUNITY): Payer: Self-pay

## 2021-08-30 DIAGNOSIS — E8881 Metabolic syndrome: Secondary | ICD-10-CM

## 2021-08-30 MED ORDER — MOUNJARO 2.5 MG/0.5ML ~~LOC~~ SOAJ
2.5000 mg | SUBCUTANEOUS | 0 refills | Status: DC
  Filled 2021-08-30 – 2021-09-07 (×2): qty 2, 28d supply, fill #0

## 2021-08-30 NOTE — Telephone Encounter (Signed)
LAST APPOINTMENT DATE: 08/14/21 NEXT APPOINTMENT DATE: 09/20/21   [facility] [address] [place] [address] Phone: [phone] Fax: [phone]  [id number] - [place] - [address] AT [address] OF [address] [place] [address] Phone: [phone] Fax: [phone]  [facility] [address] [place] [address] Phone: [phone] Fax: [phone]  Patient is requesting a refill of the following medications: Requested Prescriptions   Pending Prescriptions Disp Refills   tirzepatide (MOUNJARO) 2.5 MG/0.5ML Pen 2 mL 0    Sig: Inject 2.5 mg into the skin once a week.    Date last filled: 08/14/21 Previously prescribed by Adah Salvage, FNP  Lab Results  Component Value Date   HGBA1C 5.1 04/19/2021   Lab Results  Component Value Date   LDLCALC 89 01/29/2021   CREATININE 0.66 01/29/2021   Lab Results  Component Value Date   VD25OH 53.6 04/19/2021   VD25OH 29.04 (L) 09/28/2019   VD25OH 30.89 10/14/2017    BP Readings from Last 3 Encounters:  08/14/21 99/66  07/17/21 114/79  06/18/21 114/73

## 2021-09-07 ENCOUNTER — Other Ambulatory Visit (HOSPITAL_COMMUNITY): Payer: Self-pay

## 2021-09-11 ENCOUNTER — Telehealth (INDEPENDENT_AMBULATORY_CARE_PROVIDER_SITE_OTHER): Payer: No Typology Code available for payment source | Admitting: Psychology

## 2021-09-11 DIAGNOSIS — F5089 Other specified eating disorder: Secondary | ICD-10-CM

## 2021-09-11 DIAGNOSIS — F411 Generalized anxiety disorder: Secondary | ICD-10-CM

## 2021-09-20 ENCOUNTER — Other Ambulatory Visit (HOSPITAL_COMMUNITY): Payer: Self-pay

## 2021-09-20 ENCOUNTER — Encounter (INDEPENDENT_AMBULATORY_CARE_PROVIDER_SITE_OTHER): Payer: Self-pay | Admitting: Family Medicine

## 2021-09-20 ENCOUNTER — Other Ambulatory Visit: Payer: Self-pay

## 2021-09-20 ENCOUNTER — Ambulatory Visit (INDEPENDENT_AMBULATORY_CARE_PROVIDER_SITE_OTHER): Payer: No Typology Code available for payment source | Admitting: Family Medicine

## 2021-09-20 VITALS — BP 106/68 | HR 57 | Temp 98.1°F | Ht 64.0 in | Wt 205.0 lb

## 2021-09-20 DIAGNOSIS — Z6835 Body mass index (BMI) 35.0-35.9, adult: Secondary | ICD-10-CM

## 2021-09-20 DIAGNOSIS — F411 Generalized anxiety disorder: Secondary | ICD-10-CM | POA: Diagnosis not present

## 2021-09-20 DIAGNOSIS — E8881 Metabolic syndrome: Secondary | ICD-10-CM

## 2021-09-20 MED ORDER — TIRZEPATIDE 5 MG/0.5ML ~~LOC~~ SOAJ
5.0000 mg | SUBCUTANEOUS | 0 refills | Status: DC
  Filled 2021-09-20: qty 2, 28d supply, fill #0

## 2021-09-20 MED ORDER — FLUOXETINE HCL 40 MG PO CAPS
40.0000 mg | ORAL_CAPSULE | Freq: Every day | ORAL | 0 refills | Status: DC
  Filled 2021-09-20 – 2021-11-10 (×2): qty 90, 90d supply, fill #0

## 2021-09-20 NOTE — Progress Notes (Addendum)
Chief Complaint:   OBESITY Shannon Wagner is here to discuss her progress with her obesity treatment plan along with follow-up of her obesity related diagnoses. Shannon Wagner is on keeping a food journal and adhering to recommended goals of 1700-1800 calories and 90-120 grams of  protein and states she is following her eating plan approximately 90% of the time. Shannon Wagner states she is doing 0 minutes 0 times per week.  Today's visit was #: 7 Starting weight: 236 lbs Starting date: 04/19/2021 Today's weight: 205 lbs Today's date: 09/20/2021 Total lbs lost to date: 31 lbs Total lbs lost since last in-office visit: 6 lbs  Interim History: Shannon Wagner notes she has to focus on getting in enough protein because appetite is suppressed by [employer]. She is journaling consistently and getting in 75-100 gms of protein grams per day.  Calories average 1600-1800 grams per day. Her water intake is good.  On work days she walks about 5 miles in the course of her day.   Subjective:   1. Insulin resistance Shannon Wagner's hunger is fairly well controlled with Mounjaro. She notes more snacking recently.  Lab Results  Component Value Date   INSULIN 10.2 04/19/2021   Lab Results  Component Value Date   HGBA1C 5.1 04/19/2021    2. Generalized anxiety disorder Shannon Wagner's mood is stable. Her anxiety is well controlled. Her PMS is better than it was. We recently increased dose of Prozac 40 mg.   Assessment/Plan:   1. Insulin resistance Shannon Wagner agrees to increase dose Mounjaro to 5 mg weekly with no refills.  - tirzepatide [facility]) 5 MG/0.5ML Pen; Inject 5 mg into the skin once a week.  Dispense: 2 mL; Refill: 0  2. Generalized anxiety disorder We will refill Prozac 40 mg every day.  - FLUoxetine (PROZAC) 40 MG capsule; Take 1 capsule (40 mg total) by mouth daily.  Dispense: 90 capsule; Refill: 0  3. Obesity with current BMI of 35.17 Shannon Wagner is currently in the action stage of change. As such, her goal is  to continue with weight loss efforts. She has agreed to keeping a food journal and adhering to recommended goals of 1700/1800 calories and 90-120 grams of protein daily.  Exercise goals: Shannon Wagner was encouraged to consider doing resistance training a few days per week as well as cardio exercise.  Behavioral modification strategies: increasing lean protein intake and planning for success.  Shannon Wagner has agreed to follow-up with our clinic in 4 weeks.  Objective:   Blood pressure 106/68, pulse (!) 57, temperature 98.1 F (36.7 C), height 5\' 4"  (1.626 m), weight 205 lb (93 kg), SpO2 99 %. Body mass index is 35.19 kg/m.  General: Cooperative, alert, well developed, in no acute distress. HEENT: Conjunctivae and lids unremarkable. Cardiovascular: Regular rhythm.  Lungs: Normal work of breathing. Neurologic: No focal deficits.   Lab Results  Component Value Date   CREATININE 0.66 01/29/2021   BUN 12 01/29/2021   NA 138 01/29/2021   K 4.0 01/29/2021   CL 102 01/29/2021   CO2 23 01/29/2021   Lab Results  Component Value Date   ALT 13 01/29/2021   AST 14 01/29/2021   ALKPHOS 61 01/29/2021   BILITOT 0.6 01/29/2021   Lab Results  Component Value Date   HGBA1C 5.1 04/19/2021   Lab Results  Component Value Date   INSULIN 10.2 04/19/2021   Lab Results  Component Value Date   TSH 0.58 01/29/2021   Lab Results  Component Value Date   CHOL 166  01/29/2021   HDL 61.70 01/29/2021   LDLCALC 89 01/29/2021   TRIG 78.0 01/29/2021   CHOLHDL 3 01/29/2021   Lab Results  Component Value Date   VD25OH 53.6 04/19/2021   VD25OH 29.04 (L) 09/28/2019   VD25OH 30.89 10/14/2017   Lab Results  Component Value Date   WBC 8.6 01/29/2021   HGB 14.6 01/29/2021   HCT 43.2 01/29/2021   MCV 93.2 01/29/2021   PLT 329.0 01/29/2021   No results found for: IRON, TIBC, FERRITIN  Attestation Statements:   Reviewed by clinician on day of visit: allergies, medications, problem list, medical  history, surgical history, family history, social history, and previous encounter notes.  I, Jackson Latino, RMA, am acting as Energy manager for [place], FNP.  I have reviewed the above documentation for accuracy and completeness, and I agree with the above. -  Jesse Sans, FNP

## 2021-09-28 NOTE — Progress Notes (Signed)
°  Office: [phone]  /  Fax: [phone]    Date: October 09, 2021   Appointment Start Time: 10:03am Duration: 26 minutes Provider: Lawerance Cruel, Psy.D. Type of Session: Individual Therapy  Location of Patient: Home (private location) Location of Provider: Provider's Home (private office) Type of Contact: Telepsychological Visit via MyChart Video Visit  Session Content: Shannon Wagner is a 46 y.o. female presenting for a follow-up appointment to address the previously established treatment goal of increasing coping skills.Today's appointment was a telepsychological visit due to COVID-19. Shannon Wagner provided verbal consent for today's telepsychological appointment and she is aware she is responsible for securing confidentiality on her end of the session. Prior to proceeding with today's appointment, Shannon Wagner's physical location at the time of this appointment was obtained as well a phone number she could be reached at in the event of technical difficulties. Shannon Wagner and this provider participated in today's telepsychological service.   This provider conducted a brief check-in. Shannon Wagner shared, "I feel pretty consistent." Reflected on progress to date. A plan was developed to help Shannon Wagner cope with emotional eating behaviors in the future using learned skills. She wrote down the following plan: focus on hydration; be prepared with snacks congruent to the meal plan/goals; pause to ask questions when triggered to eat (e.g., Am I really hungry?, Is there something bothering me?, and Will I feel better if I eat?); and engage in discussed coping strategies after going through the aforementioned questions. Overall, Shannon Wagner was receptive to today's appointment as evidenced by openness to sharing, responsiveness to feedback, and willingness to continue engaging in learned skills.  Mental Status Examination:  Appearance: well groomed and appropriate hygiene  Behavior: appropriate to circumstances Mood:  neutral Affect: mood congruent Speech: WNL Eye Contact: appropriate Psychomotor Activity: WNL Gait: unable to assess Thought Process: linear, logical, and goal directed and no evidence or endorsement of suicidal, homicidal, and self-harm ideation, plan and intent  Thought Content/Perception: no hallucinations, delusions, bizarre thinking or behavior endorsed or observed Orientation: AAOx4 Memory/Concentration: memory, attention, language, and fund of knowledge intact  Insight/Judgment: good  Interventions:  Conducted a brief chart review Provided empathic reflections and validation Employed supportive psychotherapy interventions to facilitate reduced distress and to improve coping skills with identified stressors Reviewed learned skills  DSM-5 Diagnosis(es):  F50.89 Other Specified Feeding or Eating Disorder, Emotional Eating Behaviors and F41.1 Generalized Anxiety Disorder  Treatment Goal & Progress: During the initial appointment with this provider, the following treatment goal was established: increase coping skills. Shannon Wagner demonstrated progress in her goal as evidenced by increased awareness of hunger patterns, increased awareness of triggers for emotional eating behaviors, and reduction in emotional eating behaviors . Shannon Wagner also continues to demonstrate willingness to engage in learned skill(s).  Plan: As previously planned, today was Shannon Wagner last appointment with this provider. She acknowledged understanding that she may request a follow-up appointment with this provider in the future as long as she is still established with the clinic. No further follow-up planned by this provider.

## 2021-10-09 ENCOUNTER — Telehealth (INDEPENDENT_AMBULATORY_CARE_PROVIDER_SITE_OTHER): Payer: No Typology Code available for payment source | Admitting: Psychology

## 2021-10-09 DIAGNOSIS — F5089 Other specified eating disorder: Secondary | ICD-10-CM | POA: Diagnosis not present

## 2021-10-09 DIAGNOSIS — F411 Generalized anxiety disorder: Secondary | ICD-10-CM | POA: Diagnosis not present

## 2021-10-18 ENCOUNTER — Ambulatory Visit (INDEPENDENT_AMBULATORY_CARE_PROVIDER_SITE_OTHER): Payer: No Typology Code available for payment source | Admitting: Family Medicine

## 2021-10-18 ENCOUNTER — Other Ambulatory Visit (HOSPITAL_COMMUNITY): Payer: Self-pay

## 2021-10-18 ENCOUNTER — Other Ambulatory Visit: Payer: Self-pay

## 2021-10-18 ENCOUNTER — Encounter (INDEPENDENT_AMBULATORY_CARE_PROVIDER_SITE_OTHER): Payer: Self-pay | Admitting: Family Medicine

## 2021-10-18 VITALS — BP 103/54 | HR 67 | Temp 98.1°F | Ht 64.0 in | Wt 199.0 lb

## 2021-10-18 DIAGNOSIS — F411 Generalized anxiety disorder: Secondary | ICD-10-CM

## 2021-10-18 DIAGNOSIS — Z6834 Body mass index (BMI) 34.0-34.9, adult: Secondary | ICD-10-CM | POA: Diagnosis not present

## 2021-10-18 DIAGNOSIS — E669 Obesity, unspecified: Secondary | ICD-10-CM

## 2021-10-18 DIAGNOSIS — E8881 Metabolic syndrome: Secondary | ICD-10-CM

## 2021-10-18 MED ORDER — TIRZEPATIDE 5 MG/0.5ML ~~LOC~~ SOAJ
5.0000 mg | SUBCUTANEOUS | 0 refills | Status: DC
  Filled 2021-10-18: qty 2, 28d supply, fill #0

## 2021-10-18 NOTE — Progress Notes (Signed)
Chief Complaint:   OBESITY Shannon Wagner is here to discuss her progress with her obesity treatment plan along with follow-up of her obesity related diagnoses. Joycelin is on keeping a food journal and adhering to recommended goals of 1700-1800 calories and 90-120 grams of protein and states she is following her eating plan approximately 50% of the time. Dayzee states she is doing 0 minutes 0 times per week.  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  She lives with her husband and has 2 school-aged sons.  Brieanna hasn't been journaling much and would like to do PC/[place] rather than journaling. She does get in adequate protein. Her cravings are well controlled. She feels the Greggory Keen works extremely well for appetite and craving control.  Subjective:   1. Generalized anxiety disorder Christain's anxiety disorder is well controlled with Prozac 40 mg. She denies stress eating currently. She has seen Dr. Dewaine Conger for a few visits but does not have a follow-up scheduled.  2. Insulin resistance Subrena notes heartburn at night. She denies reflux. Her appetite is well controlled. She denies nausea or constipation. She is Mounjaro 5 mg.   Lab Results  Component Value Date   HGBA1C 5.1 04/19/2021   Lab Results  Component Value Date   INSULIN 10.2 04/19/2021    Assessment/Plan:   1. Generalized anxiety disorder Continue Prozac 40 mg daily.   2. Insulin resistance Agripina agrees to start Pepcid over the counter per package directions. We will Mounjaro 5 mg weekly.  - tirzepatide [facility]) 5 MG/0.5ML Pen; Inject 5 mg into the skin once a week.  Dispense: 2 mL; Refill: 0  3. Obesity with current BMI of 34.14 Renly is currently in the action stage of  change. As such, her goal is to continue with weight loss efforts. She has agreed to practicing portion control and making smarter food choices, such as increasing vegetables and decreasing simple carbohydrates 80-90 grams of protein.   Exercise goals:  Kiara will do a trial Pilates class this Sunday.  Behavioral modification strategies: planning for success.  Anarosa has agreed to follow-up with our clinic in 4 weeks with William Hamburger, NP.   Objective:   Blood pressure (!) 103/54, pulse 67, temperature 98.1 F (36.7 C), height 5\' 4"  (1.626 m), weight 199 lb (90.3 kg), SpO2 98 %. Body mass index is 34.16 kg/m.  General: Cooperative, alert, well developed, in no acute distress. HEENT: Conjunctivae and lids unremarkable. Cardiovascular: Regular rhythm.  Lungs: Normal work of breathing. Neurologic: No focal deficits.   Lab Results  Component Value Date   CREATININE 0.66 01/29/2021   BUN 12 01/29/2021   NA 138 01/29/2021   K 4.0 01/29/2021   CL 102 01/29/2021   CO2 23 01/29/2021   Lab Results  Component Value Date   ALT 13 01/29/2021   AST 14 01/29/2021   ALKPHOS 61 01/29/2021   BILITOT 0.6 01/29/2021   Lab Results  Component Value Date   HGBA1C 5.1 04/19/2021   Lab Results  Component Value Date   INSULIN 10.2 04/19/2021   Lab Results  Component Value Date   TSH 0.58 01/29/2021   Lab Results  Component Value Date   CHOL 166 01/29/2021   HDL 61.70 01/29/2021  LDLCALC 89 01/29/2021   TRIG 78.0 01/29/2021   CHOLHDL 3 01/29/2021   Lab Results  Component Value Date   VD25OH 53.6 04/19/2021   VD25OH 29.04 (L) 09/28/2019   VD25OH 30.89 10/14/2017   Lab Results  Component Value Date   WBC 8.6 01/29/2021   HGB 14.6 01/29/2021   HCT 43.2 01/29/2021   MCV 93.2 01/29/2021   PLT 329.0 01/29/2021   No results found for: IRON, TIBC, FERRITIN  Attestation Statements:   Reviewed by clinician on day of visit: allergies, medications, problem list, medical  history, surgical history, family history, social history, and previous encounter notes.  I, Jackson Latino, RMA, am acting as Energy manager for [place], FNP.  I have reviewed the above documentation for accuracy and completeness, and I agree with the above. -  Jesse Sans, FNP

## 2021-11-10 ENCOUNTER — Other Ambulatory Visit: Payer: Self-pay | Admitting: Physician Assistant

## 2021-11-12 ENCOUNTER — Other Ambulatory Visit (HOSPITAL_COMMUNITY): Payer: Self-pay

## 2021-11-12 MED ORDER — LEVOTHYROXINE SODIUM 175 MCG PO TABS
175.0000 ug | ORAL_TABLET | Freq: Every day | ORAL | 0 refills | Status: DC
  Filled 2021-11-12: qty 30, 30d supply, fill #0

## 2021-11-19 ENCOUNTER — Telehealth: Payer: No Typology Code available for payment source | Admitting: Physician Assistant

## 2021-11-19 ENCOUNTER — Other Ambulatory Visit: Payer: Self-pay

## 2021-11-19 ENCOUNTER — Other Ambulatory Visit (HOSPITAL_COMMUNITY): Payer: Self-pay

## 2021-11-19 ENCOUNTER — Encounter (INDEPENDENT_AMBULATORY_CARE_PROVIDER_SITE_OTHER): Payer: Self-pay | Admitting: Adult Health

## 2021-11-19 ENCOUNTER — Ambulatory Visit (INDEPENDENT_AMBULATORY_CARE_PROVIDER_SITE_OTHER): Payer: No Typology Code available for payment source | Admitting: Adult Health

## 2021-11-19 VITALS — BP 111/70 | HR 59 | Temp 97.4°F | Ht 64.0 in | Wt 196.0 lb

## 2021-11-19 DIAGNOSIS — E8881 Metabolic syndrome: Secondary | ICD-10-CM

## 2021-11-19 DIAGNOSIS — K047 Periapical abscess without sinus: Secondary | ICD-10-CM | POA: Diagnosis not present

## 2021-11-19 DIAGNOSIS — Z6841 Body Mass Index (BMI) 40.0 and over, adult: Secondary | ICD-10-CM

## 2021-11-19 DIAGNOSIS — Z9189 Other specified personal risk factors, not elsewhere classified: Secondary | ICD-10-CM

## 2021-11-19 DIAGNOSIS — E038 Other specified hypothyroidism: Secondary | ICD-10-CM | POA: Diagnosis not present

## 2021-11-19 DIAGNOSIS — E669 Obesity, unspecified: Secondary | ICD-10-CM

## 2021-11-19 DIAGNOSIS — Z6833 Body mass index (BMI) 33.0-33.9, adult: Secondary | ICD-10-CM

## 2021-11-19 DIAGNOSIS — E66813 Obesity, class 3: Secondary | ICD-10-CM

## 2021-11-19 DIAGNOSIS — E88819 Insulin resistance, unspecified: Secondary | ICD-10-CM

## 2021-11-19 MED ORDER — AMOXICILLIN 500 MG PO CAPS
500.0000 mg | ORAL_CAPSULE | Freq: Three times a day (TID) | ORAL | 0 refills | Status: DC
  Filled 2021-11-19: qty 30, 10d supply, fill #0

## 2021-11-19 MED ORDER — TIRZEPATIDE 7.5 MG/0.5ML ~~LOC~~ SOAJ
7.5000 mg | SUBCUTANEOUS | 0 refills | Status: DC
  Filled 2021-11-19: qty 2, 28d supply, fill #0

## 2021-11-19 NOTE — Progress Notes (Signed)
I have spent 5 minutes in review of e-visit questionnaire, review and updating patient chart, medical decision making and response to patient.   Mabel Roll Cody Ahad Colarusso, PA-C    

## 2021-11-19 NOTE — Progress Notes (Signed)
? ? ? ?Chief Complaint:  ? ?OBESITY ?Shannon Wagner is here to discuss her progress with her obesity treatment plan along with follow-up of her obesity related diagnoses. Shannon Wagner is on practicing portion control and making smarter food choices, such as increasing vegetables and decreasing simple carbohydrates with 80-90 grams of protein and states she is following her eating plan approximately 80% of the time. Shannon Wagner states she is doing pilates for 60 minutes 1 time per week. ? ?Today's visit was #: 9 ?Starting weight: 236 lbs ?Starting date: 04/19/2021 ?Today's weight: 196 lbs ?Today's date: 11/19/2021 ?Total lbs lost to date: 40 lbs ?Total lbs lost since last in-office visit: 3 lbs ? ?Interim History:  ?At her last office visit, Shannon Wagner's meal plan was converted to PC/[place] with a goal of 80-90 grams of protein/day -this modification has yielded better compliance with her eating plan. ?If she requires more structure she will add in journaling. To track intake.   ? 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 ? ?Of note:  Patient is new to me. ? ?Subjective:  ? ?1. Insulin resistance ?On 04/19/2021, insulin level - 10.2 - above goal. ?On 07/17/2021, started on Mounjaro 2.5 mg weekly. ?ON 09/20/2021, Mounjaro increased to 5 mg. ?She denies mass in neck, dysphagia, dyspepsia, persistent hoarseness, abd pain, or N/V/Constipaton. ?She endorses breakthrough polyphagia. ? ?2. Other specified hypothyroidism ?Her father has hypothyroidism. ?PCP manages levothyroxine- currently on Q AM. ? ?3. At risk for nausea ?Shannon Wagner is at risk for nausea due to increasing Mounjaro. ? ?Assessment/Plan:  ? ?1. Insulin resistance ?Check fasting labs at next office visit. ?Refill and increase Mounjaro 7.5 mg once weekly. ? ?- Increase tirzepatide (MOUNJARO) 7.5 MG/0.5ML Pen; Inject 7.5 mg into the skin once a week.  Dispense: 6 mL; Refill: 0 ? ?Consider converting Mounjaro to [facility] in future - due to lack of insurance  coverage. ? ?2. Other specified hypothyroidism ?Check thyroid panel at next office visit. ? ?3. At risk for nausea ?Shannon Wagner was given approximately 15 minutes of nausea prevention counseling today. Shannon Wagner is at risk for nausea due to her new or current medication. She was encouraged to titrate her medication slowly, make sure to stay hydrated, eat smaller portions throughout the day, and avoid high fat meals.  ? ?4. Obesity with current BMI of 33.7 ? ?Shannon Wagner is currently in the action stage of change. As such, her goal is to continue with weight loss efforts. She has agreed to practicing portion control and making smarter food choices, such as increasing vegetables and decreasing simple carbohydrates with 80-90 grams of protein/day.  ? ?Check fasting labs at next office visit. ? ?Exercise goals:  As is. ? ?Behavioral modification strategies: increasing lean protein intake, decreasing simple carbohydrates, meal planning and cooking strategies, keeping healthy foods in the home, better snacking choices, and planning for success. ? ?Shannon Wagner has agreed to follow-up with our clinic in 4 weeks. She was informed of the importance of frequent follow-up visits to maximize her success with intensive lifestyle modifications for her multiple health conditions.  ? ?Objective:  ? ?Blood pressure 111/70, pulse (!) 59, temperature (!) 97.4 ?F (36.3 ?C), height 5\' 4"  (1.626 m), weight 196 lb (88.9 kg), SpO2 99 %. ?Body mass index is 33.64 kg/m?. ? ?General: Cooperative, alert, well developed, in no acute distress. ?HEENT: Conjunctivae and lids unremarkable. ?Cardiovascular: Regular rhythm.  ?Lungs: Normal work of breathing. ?Neurologic: No focal deficits.  ? ?Lab Results  ?Component  Value Date  ? CREATININE 0.66 01/29/2021  ? BUN 12 01/29/2021  ? NA 138 01/29/2021  ? K 4.0 01/29/2021  ? CL 102 01/29/2021  ? CO2 23 01/29/2021  ? ?Lab Results  ?Component Value Date  ? ALT 13 01/29/2021  ? AST 14 01/29/2021  ? ALKPHOS 61  01/29/2021  ? BILITOT 0.6 01/29/2021  ? ?Lab Results  ?Component Value Date  ? HGBA1C 5.1 04/19/2021  ? ?Lab Results  ?Component Value Date  ? INSULIN 10.2 04/19/2021  ? ?Lab Results  ?Component Value Date  ? TSH 0.58 01/29/2021  ? ?Lab Results  ?Component Value Date  ? CHOL 166 01/29/2021  ? HDL 61.70 01/29/2021  ? LDLCALC 89 01/29/2021  ? TRIG 78.0 01/29/2021  ? CHOLHDL 3 01/29/2021  ? ?Lab Results  ?Component Value Date  ? VD25OH 53.6 04/19/2021  ? VD25OH 29.04 (L) 09/28/2019  ? VD25OH 30.89 10/14/2017  ? ?Lab Results  ?Component Value Date  ? WBC 8.6 01/29/2021  ? HGB 14.6 01/29/2021  ? HCT 43.2 01/29/2021  ? MCV 93.2 01/29/2021  ? PLT 329.0 01/29/2021  ? ?Attestation Statements:  ? ?Reviewed by clinician on day of visit: allergies, medications, problem list, medical history, surgical history, family history, social history, and previous encounter notes. ? ?I, Insurance claims handler, CMA, am acting as Energy manager for William Hamburger, NP. ? ?I have reviewed the above documentation for accuracy and completeness, and I agree with the above. -  Sunshyne Horvath d. Avion Patella, NP-C ?

## 2021-11-19 NOTE — Progress Notes (Signed)
E-Visit for Dental Pain ? ?We are sorry that you are not feeling well.  Here is how we plan to help! ? ?Based on what you have shared with me in the questionnaire, it sounds like you have a possible dental infection making harder for area of concern to heal.  ? ?I have prescribed Amoxicillin 500mg  3 times per day for 10 days. Alternate Tylenol with your Ibuprofen for pain relief.  ? ?It is imperative that you see a dentist within 10 days of this eVisit to determine the cause of the dental pain and be sure it is adequately treated ? ?A toothache or tooth pain is caused when the nerve in the root of a tooth or surrounding a tooth is irritated. Dental (tooth) infection, decay, injury, or loss of a tooth are the most common causes of dental pain. Pain may also occur after an extraction (tooth is pulled out). Pain sometimes originates from other areas and radiates to the jaw, thus appearing to be tooth pain.Bacteria growing inside your mouth can contribute to gum disease and dental decay, both of which can cause pain. A toothache occurs from inflammation of the central portion of the tooth called pulp. The pulp contains nerve endings that are very sensitive to pain. Inflammation to the pulp or pulpitis may be caused by dental cavities, trauma, and infection.  ? ? ?HOME CARE:  ? ?For toothaches: ?Over-the-counter pain medications such as acetaminophen or ibuprofen may be used. Take these as directed on the package while you arrange for a dental appointment. ?Avoid very cold or hot foods, because they may make the pain worse. ?You may get relief from biting on a cotton ball soaked in oil of cloves. You can get oil of cloves at most drug stores. ? ?For jaw pain: ? Aspirin may be helpful for problems in the joint of the jaw in adults. ?If pain happens every time you open your mouth widely, the temporomandibular joint (TMJ) may be the source of the pain. Yawning or taking a large bite of food may worsen the pain. An  appointment with your doctor or dentist will help you find the cause. ?  ? ? ?GET HELP RIGHT AWAY IF: ? ?You have a high fever or chills ?If you have had a recent head or face injury and develop headache, light headedness, nausea, vomiting, or other symptoms that concern you after an injury to your face or mouth, you could have a more serious injury in addition to your dental injury. ?A facial rash associated with a toothache: This condition may improve with medication. Contact your doctor for them to decide what is appropriate. ?Any jaw pain occurring with chest pain: Although jaw pain is most commonly caused by dental disease, it is sometimes referred pain from other areas. People with heart disease, especially people who have had stents placed, people with diabetes, or those who have had heart surgery may have jaw pain as a symptom of heart attack or angina. If your jaw or tooth pain is associated with lightheadedness, sweating, or shortness of breath, you should see a doctor as soon as possible. ?Trouble swallowing or excessive pain or bleeding from gums: If you have a history of a weakened immune system, diabetes, or steroid use, you may be more susceptible to infections. Infections can often be more severe and extensive or caused by unusual organisms. Dental and gum infections in people with these conditions may require more aggressive treatment. An abscess may need draining or IV antibiotics,  for example. ? ?MAKE SURE YOU  ? ?Understand these instructions. ?Will watch your condition. ?Will get help right away if you are not doing well or get worse. ? ?Thank you for choosing an e-visit. ? ?Your e-visit answers were reviewed by a board certified advanced clinical practitioner to complete your personal care plan. Depending upon the condition, your plan could have included both over the counter or prescription medications. ? ?Please review your pharmacy choice. Make sure the pharmacy is open so you can pick up  prescription now. If there is a problem, you may contact your provider through [employer] and have the prescription routed to another pharmacy.  Your safety is important to [name]. If you have drug allergies check your prescription carefully.  ? ?For the next 24 hours you can use MyChart to ask questions about today's visit, request a non-urgent call back, or ask for a work or school excuse. ?You will get an email in the next two days asking about your experience. I hope that your e-visit has been valuable and will speed your recovery. ? ?

## 2021-11-29 ENCOUNTER — Other Ambulatory Visit (HOSPITAL_COMMUNITY): Payer: Self-pay

## 2021-11-29 MED ORDER — TRIAMCINOLONE ACETONIDE 0.1 % MT PSTE
PASTE | OROMUCOSAL | 1 refills | Status: AC
  Filled 2021-11-29: qty 5, 30d supply, fill #0

## 2021-11-29 MED ORDER — LIDOCAINE VISCOUS HCL 2 % MT SOLN
15.0000 mL | Freq: Four times a day (QID) | OROMUCOSAL | 0 refills | Status: DC
  Filled 2021-11-29: qty 400, 7d supply, fill #0

## 2021-12-13 ENCOUNTER — Ambulatory Visit (INDEPENDENT_AMBULATORY_CARE_PROVIDER_SITE_OTHER): Payer: No Typology Code available for payment source | Admitting: Adult Health

## 2021-12-13 ENCOUNTER — Encounter (INDEPENDENT_AMBULATORY_CARE_PROVIDER_SITE_OTHER): Payer: Self-pay | Admitting: Adult Health

## 2021-12-13 ENCOUNTER — Other Ambulatory Visit (HOSPITAL_COMMUNITY): Payer: Self-pay

## 2021-12-13 VITALS — BP 103/70 | HR 56 | Temp 97.9°F | Ht 64.0 in | Wt 190.0 lb

## 2021-12-13 DIAGNOSIS — Z9189 Other specified personal risk factors, not elsewhere classified: Secondary | ICD-10-CM

## 2021-12-13 DIAGNOSIS — Z8249 Family history of ischemic heart disease and other diseases of the circulatory system: Secondary | ICD-10-CM

## 2021-12-13 DIAGNOSIS — E669 Obesity, unspecified: Secondary | ICD-10-CM | POA: Diagnosis not present

## 2021-12-13 DIAGNOSIS — E8881 Metabolic syndrome: Secondary | ICD-10-CM

## 2021-12-13 DIAGNOSIS — E038 Other specified hypothyroidism: Secondary | ICD-10-CM | POA: Diagnosis not present

## 2021-12-13 DIAGNOSIS — Z6832 Body mass index (BMI) 32.0-32.9, adult: Secondary | ICD-10-CM

## 2021-12-13 DIAGNOSIS — E559 Vitamin D deficiency, unspecified: Secondary | ICD-10-CM | POA: Diagnosis not present

## 2021-12-13 MED ORDER — WEGOVY 1.7 MG/0.75ML ~~LOC~~ SOAJ
1.7000 mg | SUBCUTANEOUS | 0 refills | Status: DC
  Filled 2021-12-13 – 2021-12-21 (×2): qty 3, 28d supply, fill #0

## 2021-12-14 ENCOUNTER — Other Ambulatory Visit (HOSPITAL_COMMUNITY): Payer: Self-pay

## 2021-12-14 ENCOUNTER — Other Ambulatory Visit: Payer: Self-pay | Admitting: Physician Assistant

## 2021-12-14 LAB — TSH+T4F+T3FREE
Free T4: 2.08 ng/dL — ABNORMAL HIGH (ref 0.82–1.77)
T3, Free: 2.8 pg/mL (ref 2.0–4.4)
TSH: 1.32 u[IU]/mL (ref 0.450–4.500)

## 2021-12-14 LAB — COMPREHENSIVE METABOLIC PANEL
ALT: 12 IU/L (ref 0–32)
AST: 14 IU/L (ref 0–40)
Albumin/Globulin Ratio: 2.4 — ABNORMAL HIGH (ref 1.2–2.2)
Albumin: 5.1 g/dL — ABNORMAL HIGH (ref 3.8–4.8)
Alkaline Phosphatase: 62 IU/L (ref 44–121)
BUN/Creatinine Ratio: 16 (ref 9–23)
BUN: 13 mg/dL (ref 6–24)
Bilirubin Total: 0.5 mg/dL (ref 0.0–1.2)
CO2: 22 mmol/L (ref 20–29)
Calcium: 9.7 mg/dL (ref 8.7–10.2)
Chloride: 101 mmol/L (ref 96–106)
Creatinine, Ser: 0.82 mg/dL (ref 0.57–1.00)
Globulin, Total: 2.1 g/dL (ref 1.5–4.5)
Glucose: 69 mg/dL — ABNORMAL LOW (ref 70–99)
Potassium: 4.6 mmol/L (ref 3.5–5.2)
Sodium: 138 mmol/L (ref 134–144)
Total Protein: 7.2 g/dL (ref 6.0–8.5)
eGFR: 91 mL/min/{1.73_m2} (ref >59)

## 2021-12-14 LAB — INSULIN, RANDOM: INSULIN: 2.3 u[IU]/mL — ABNORMAL LOW (ref 2.6–24.9)

## 2021-12-14 LAB — LIPID PANEL
Chol/HDL Ratio: 2.7 ratio (ref 0.0–4.4)
Cholesterol, Total: 170 mg/dL (ref 100–199)
HDL: 63 mg/dL (ref >39)
LDL Chol Calc (NIH): 94 mg/dL (ref 0–99)
Triglycerides: 66 mg/dL (ref 0–149)
VLDL Cholesterol Cal: 13 mg/dL (ref 5–40)

## 2021-12-14 LAB — HEMOGLOBIN A1C
Est. average glucose Bld gHb Est-mCnc: 94 mg/dL
Hgb A1c MFr Bld: 4.9 % (ref 4.8–5.6)

## 2021-12-14 LAB — VITAMIN D 25 HYDROXY (VIT D DEFICIENCY, FRACTURES): Vit D, 25-Hydroxy: 92.4 ng/mL (ref 30.0–100.0)

## 2021-12-17 ENCOUNTER — Other Ambulatory Visit (HOSPITAL_COMMUNITY): Payer: Self-pay

## 2021-12-17 MED ORDER — LEVOTHYROXINE SODIUM 175 MCG PO TABS
175.0000 ug | ORAL_TABLET | Freq: Every day | ORAL | 0 refills | Status: DC
Start: 2021-12-17 — End: 2022-03-13
  Filled 2021-12-17: qty 90, 90d supply, fill #0

## 2021-12-19 NOTE — Progress Notes (Signed)
? ? ? ?Chief Complaint:  ? ?OBESITY ?Shannon Wagner is here to discuss her progress with her obesity treatment plan along with follow-up of her obesity related diagnoses. Shannon Wagner is on practicing portion control and making smarter food choices, such as increasing vegetables and decreasing simple carbohydrates with 80-90 grams of protein/day and states she is following her eating plan approximately 75-80% of the time. Shannon Wagner states she is doing pilates for 60 minutes 1 time per week. ? ?Today's visit was #: 10 ?Starting weight: 236 lbs ?Starting date: 04/19/2021 ?Today's weight: 190 lbs ?Today's date: 12/13/2021 ?Total lbs lost to date: 46 lbs ?Total lbs lost since last in-office visit: 6 lbs ? ?Interim History:  ?Shaleka is on Mounjaro 7.5 mg.  Last injection on 12/08/2021 . ?She has 2 more doses at home. 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 ? ?Subjective:  ? ?1. Other specified hypothyroidism ?Currently on levothyroxine 175 mcg. ? ?2. Vitamin D deficiency ?She is on OTC vitamin D3 5,000 IU daily. ? ?3. Family history of early CAD ?Family history of hyperlipidemia - father - MI, cardiomyopathy. ?Paternal uncle - passed away at age 55 from MI. ?Paternal grandmother (late 32s) and grandfather (36) both suffered MI. ? ?4. Insulin resistance ?Shannon Wagner is on Mounjaro 7.5 mg.  Last injection on 12/08/2021 . ?She has 2 more doses at home. ?Since she is not diabetic, unable to obtain future Mounjaro RFs. ? ?5. At risk for heart disease ?Shannon Wagner is at higher than average risk for cardiovascular disease due to family history of hyperlipidemia and obesity.  ? ?Assessment/Plan:  ? ?1. Other specified hypothyroidism ?Check labs today. ? ?- TSH+T4F+T3Free ? ?2. Vitamin D deficiency ?Check vitamin D level today. ? ?- VITAMIN D 25 Hydroxy (Vit-D Deficiency, Fractures) ? ?3. Family history of early CAD ?Check labs. ? ?- Lipid panel ? ?4. Insulin resistance ?Check labs today. ?Stop Mounjaro.  Start Wegovy 1.7 mg once weekly. ? ?-  Comprehensive metabolic panel ?- Hemoglobin A1c ?- Insulin, random ? ?5. At risk for heart disease ?[name] was given approximately 15 minutes of coronary artery disease prevention counseling today. She is 46 y.o. female and has risk factors for heart disease including obesity. We discussed intensive lifestyle modifications today with an emphasis on specific weight loss instructions and strategies. ? ?Repetitive spaced learning was employed today to elicit superior memory formation and behavioral change.  ? ?6. Obesity with current BMI of 32.7 ? ?[name] is currently in the action stage of change. As such, her goal is to continue with weight loss efforts. She has agreed to practicing portion control and making smarter food choices, such as increasing vegetables and decreasing simple carbohydrates.  ? ?Stop Mounjaro. ? ?Start Wegovy 1.7 mg once weekly. ?- Start Semaglutide-Weight Management (WEGOVY) 1.7 MG/0.75ML SOAJ; Inject 1.7 mg into the skin once a week.  Dispense: 3 mL; Refill: 0 ? ? ?Exercise goals:  As is. ? ?Behavioral modification strategies: increasing lean protein intake, decreasing simple carbohydrates, meal planning and cooking strategies, keeping healthy foods in the home, and planning for success. ? ?Shannon Wagner has agreed to follow-up with our clinic in 4 weeks. She was informed of the importance of frequent follow-up visits to maximize her success with intensive lifestyle modifications for her multiple health conditions.  ? ?[name] was informed we would discuss her lab results at her next visit unless there is a critical issue that needs to be addressed sooner. Shannon Wagner agreed to keep her next visit at the agreed upon time to discuss  these results. ? ?Objective:  ? ?Blood pressure 103/70, pulse (!) 56, temperature 97.9 ?F (36.6 ?C), height 5\' 4"  (1.626 m), weight 190 lb (86.2 kg), SpO2 100 %. ?Body mass index is 32.61 kg/m?. ? ?General: Cooperative, alert, well developed, in no acute  distress. ?HEENT: Conjunctivae and lids unremarkable. ?Cardiovascular: Regular rhythm.  ?Lungs: Normal work of breathing. ?Neurologic: No focal deficits.  ? ?Lab Results  ?Component Value Date  ? CREATININE 0.82 12/13/2021  ? BUN 13 12/13/2021  ? NA 138 12/13/2021  ? K 4.6 12/13/2021  ? CL 101 12/13/2021  ? CO2 22 12/13/2021  ? ?Lab Results  ?Component Value Date  ? ALT 12 12/13/2021  ? AST 14 12/13/2021  ? ALKPHOS 62 12/13/2021  ? BILITOT 0.5 12/13/2021  ? ?Lab Results  ?Component Value Date  ? HGBA1C 4.9 12/13/2021  ? HGBA1C 5.1 04/19/2021  ? ?Lab Results  ?Component Value Date  ? INSULIN 2.3 (L) 12/13/2021  ? INSULIN 10.2 04/19/2021  ? ?Lab Results  ?Component Value Date  ? TSH 1.320 12/13/2021  ? ?Lab Results  ?Component Value Date  ? CHOL 170 12/13/2021  ? HDL 63 12/13/2021  ? LDLCALC 94 12/13/2021  ? TRIG 66 12/13/2021  ? CHOLHDL 2.7 12/13/2021  ? ?Lab Results  ?Component Value Date  ? VD25OH 92.4 12/13/2021  ? VD25OH 53.6 04/19/2021  ? VD25OH 29.04 (L) 09/28/2019  ? ?Lab Results  ?Component Value Date  ? WBC 8.6 01/29/2021  ? HGB 14.6 01/29/2021  ? HCT 43.2 01/29/2021  ? MCV 93.2 01/29/2021  ? PLT 329.0 01/29/2021  ? ?Attestation Statements:  ? ?Reviewed by clinician on day of visit: allergies, medications, problem list, medical history, surgical history, family history, social history, and previous encounter notes. ? ?I, 01/31/2021, CMA, am acting as Insurance claims handler for Energy manager, NP. ? ?I have reviewed the above documentation for accuracy and completeness, and I agree with the above. -  Miaya Lafontant d. Conda Wannamaker, NP-C ?

## 2021-12-21 ENCOUNTER — Other Ambulatory Visit (HOSPITAL_COMMUNITY): Payer: Self-pay

## 2021-12-25 DIAGNOSIS — E559 Vitamin D deficiency, unspecified: Secondary | ICD-10-CM | POA: Insufficient documentation

## 2022-01-15 ENCOUNTER — Ambulatory Visit (INDEPENDENT_AMBULATORY_CARE_PROVIDER_SITE_OTHER): Payer: No Typology Code available for payment source | Admitting: Adult Health

## 2022-01-15 ENCOUNTER — Other Ambulatory Visit (HOSPITAL_COMMUNITY): Payer: Self-pay

## 2022-01-15 ENCOUNTER — Encounter (INDEPENDENT_AMBULATORY_CARE_PROVIDER_SITE_OTHER): Payer: Self-pay | Admitting: Adult Health

## 2022-01-15 VITALS — BP 97/65 | HR 67 | Temp 97.9°F | Ht 64.0 in | Wt 190.0 lb

## 2022-01-15 DIAGNOSIS — E559 Vitamin D deficiency, unspecified: Secondary | ICD-10-CM | POA: Diagnosis not present

## 2022-01-15 DIAGNOSIS — E88819 Insulin resistance, unspecified: Secondary | ICD-10-CM

## 2022-01-15 DIAGNOSIS — Z8249 Family history of ischemic heart disease and other diseases of the circulatory system: Secondary | ICD-10-CM

## 2022-01-15 DIAGNOSIS — E8881 Metabolic syndrome: Secondary | ICD-10-CM | POA: Diagnosis not present

## 2022-01-15 DIAGNOSIS — F411 Generalized anxiety disorder: Secondary | ICD-10-CM

## 2022-01-15 DIAGNOSIS — Z6832 Body mass index (BMI) 32.0-32.9, adult: Secondary | ICD-10-CM

## 2022-01-15 DIAGNOSIS — E038 Other specified hypothyroidism: Secondary | ICD-10-CM | POA: Diagnosis not present

## 2022-01-15 DIAGNOSIS — Z9189 Other specified personal risk factors, not elsewhere classified: Secondary | ICD-10-CM

## 2022-01-15 DIAGNOSIS — E66813 Obesity, class 3: Secondary | ICD-10-CM

## 2022-01-15 DIAGNOSIS — E669 Obesity, unspecified: Secondary | ICD-10-CM

## 2022-01-15 MED ORDER — FLUOXETINE HCL 40 MG PO CAPS
40.0000 mg | ORAL_CAPSULE | Freq: Every day | ORAL | 0 refills | Status: DC
  Filled 2022-01-15 – 2022-02-11 (×2): qty 90, 90d supply, fill #0

## 2022-01-15 MED ORDER — WEGOVY 1.7 MG/0.75ML ~~LOC~~ SOAJ
1.7000 mg | SUBCUTANEOUS | 0 refills | Status: DC
  Filled 2022-01-15: qty 3, 28d supply, fill #0

## 2022-01-21 NOTE — Progress Notes (Signed)
? ? ? ?Chief Complaint:  ? ?OBESITY ?Shannon Wagner is here to discuss her progress with her obesity treatment plan along with follow-up of her obesity related diagnoses. Shannon Wagner is on practicing portion control and making smarter food choices, such as increasing vegetables and decreasing simple carbohydrates and states she is following her eating plan approximately 80% of the time. Shannon Wagner states she is doing Pilates and walking for 30-60 minutes 2-3 times per week. ? ?Today's visit was #: 11 ?Starting weight: 236 lbs ?Starting date: 04/19/2021 ?Today's weight: 190 lbs ?Today's date: 01/15/22 ?Total lbs lost to date: 46 lbs  ?Total lbs lost since last in-office visit: 0 ? ?Interim History:  ?Mounjaro replaced with Wegovy 1.7 mg at last office visit-12/13/2021. ?Has had 2 doses, then third dose malfunctioned. The medication did not dispense completely into tissue. ? ?Bioimpedance results discussed with pt ?Muscle Mass + 0.8 lbs ?Adipose Mass -  0.6 lbs ? ?Non Scale victories: ?1.  Has not worn shorts since 6th grade, wearing shorts today in clinic. ?2.  States "I want to exercise now!". ? ?Subjective:  ? ?1. Insulin resistance ?12/13/2021 blood glucose 69, A1c 4.9, insulin level 7.3.  She experienced 1 episode of hypoglycemia. ?Mounjaro replaced with Wegovy 1.7 mg at last office visit-12/13/2021. ?Has had 2 doses, then third dose malfunctioned. The medication did not dispense completely into tissue. ? ?2. Vitamin D deficiency ?12/13/21 - 92.4.   High normal. ?She denies nausea, vomiting, muscle weakness.   ?She is on OTC vitamin D supplementation- unsure of dosage. ? ?3. Other specified hypothyroidism ?12/13/2021-thyroid panel TSH 1.320, T4 free 2.08. ?She denies palpitations, chest pain, sweating. ?She is on levothyroxine 175 mcg daily-managed by PCP. ? ?4. Generalized anxiety disorder ?She endorses stable mood. ?She denies suicidal ideation/homicidal ideation. ?She is on fluoxetine 40 mg daily. ? ?5.  Family history of heart  disease ?12/13/2021 lipid panel - normal. ?Not on statin therapy. ? ?6. At risk for hypoglycemia ?Shannon Wagner is at risk for hypoglycemia due to steady weight loss and Wegovy use. ? ? ?Assessment/Plan:  ? ?1. Insulin resistance ?Shannon Wagner snacks at all times.   ?Continue GLP-1 therapy.  Eat every few hours. ?Lab results were discussed. ? ?2. Vitamin D deficiency ?Stop all vitamin D supplementation.   ?Check labs in about 2 months. ?Lab results were discussed. ? ?3. Other specified hypothyroidism ?Follow-up with PCP. ?Lab results were discussed. ? ?4. Generalized anxiety disorder ?Refill: ?- FLUoxetine (PROZAC) 40 MG capsule; Take 1 capsule (40 mg total) by mouth daily.  Dispense: 90 capsule; Refill: 0 ? ?5. Family history of heart disease ?Continue healthy eating and regular exercise. ?Lab results were discussed. ? ?6. At risk for hypoglycemia ?Shannon Wagner was given approximately 15 minutes of counseling today regarding prevention of hypoglycemia. She was advised of symptoms of hypoglycemia. Eddis was instructed to avoid skipping meals, eat regular protein rich meals, and schedule low calorie snacks as needed. ? ?Repetitive spaced learning was employed today to elicit superior memory formation and behavioral change.  ? ?7. Obesity with current BMI of 32.6 ? ?Refill- Semaglutide-Weight Management (WEGOVY) 1.7 MG/0.75ML SOAJ; Inject 1.7 mg into the skin once a week.  Dispense: 3 mL; Refill: 0 ? ?Shannon Wagner is currently in the action stage of change. As such, her goal is to continue with weight loss efforts. She has agreed to practicing portion control and making smarter food choices, such as increasing vegetables and decreasing simple carbohydrates.  ? ?Exercise goals: Continue current regimen. ? ?Behavioral modification strategies: increasing lean  protein intake, decreasing simple carbohydrates, meal planning and cooking strategies, keeping healthy foods in the home, and planning for success. ? ?Shannon Wagner has agreed to  follow-up with our clinic in 4 weeks. She was informed of the importance of frequent follow-up visits to maximize her success with intensive lifestyle modifications for her multiple health conditions.  ? ?Objective:  ? ?Blood pressure 97/65, pulse 67, temperature 97.9 ?F (36.6 ?C), height 5\' 4"  (1.626 m), weight 190 lb (86.2 kg), SpO2 97 %. ?Body mass index is 32.61 kg/m?. ? ?General: Cooperative, alert, well developed, in no acute distress. ?HEENT: Conjunctivae and lids unremarkable. ?Cardiovascular: Regular rhythm.  ?Lungs: Normal work of breathing. ?Neurologic: No focal deficits.  ? ?Lab Results  ?Component Value Date  ? CREATININE 0.82 12/13/2021  ? BUN 13 12/13/2021  ? NA 138 12/13/2021  ? K 4.6 12/13/2021  ? CL 101 12/13/2021  ? CO2 22 12/13/2021  ? ?Lab Results  ?Component Value Date  ? ALT 12 12/13/2021  ? AST 14 12/13/2021  ? ALKPHOS 62 12/13/2021  ? BILITOT 0.5 12/13/2021  ? ?Lab Results  ?Component Value Date  ? HGBA1C 4.9 12/13/2021  ? HGBA1C 5.1 04/19/2021  ? ?Lab Results  ?Component Value Date  ? INSULIN 2.3 (L) 12/13/2021  ? INSULIN 10.2 04/19/2021  ? ?Lab Results  ?Component Value Date  ? TSH 1.320 12/13/2021  ? ?Lab Results  ?Component Value Date  ? CHOL 170 12/13/2021  ? HDL 63 12/13/2021  ? LDLCALC 94 12/13/2021  ? TRIG 66 12/13/2021  ? CHOLHDL 2.7 12/13/2021  ? ?Lab Results  ?Component Value Date  ? VD25OH 92.4 12/13/2021  ? VD25OH 53.6 04/19/2021  ? VD25OH 29.04 (L) 09/28/2019  ? ?Lab Results  ?Component Value Date  ? WBC 8.6 01/29/2021  ? HGB 14.6 01/29/2021  ? HCT 43.2 01/29/2021  ? MCV 93.2 01/29/2021  ? PLT 329.0 01/29/2021  ? ?No results found for: IRON, TIBC, FERRITIN ? ?Attestation Statements:  ? ?Reviewed by clinician on day of visit: allergies, medications, problem list, medical history, surgical history, family history, social history, and previous encounter notes. ? ? ?I, Dawn 01/31/2021, FNP, am acting as Kelton Pillar for Energy manager, NP. ? ?I have reviewed the above  documentation for accuracy and completeness, and I agree with the above. -  Shamecka Hocutt d. Aldyn Toon, NP-C  ?

## 2022-01-23 ENCOUNTER — Other Ambulatory Visit (HOSPITAL_COMMUNITY): Payer: Self-pay

## 2022-01-23 DIAGNOSIS — Z8249 Family history of ischemic heart disease and other diseases of the circulatory system: Secondary | ICD-10-CM | POA: Insufficient documentation

## 2022-02-12 ENCOUNTER — Ambulatory Visit (INDEPENDENT_AMBULATORY_CARE_PROVIDER_SITE_OTHER): Payer: No Typology Code available for payment source | Admitting: Adult Health

## 2022-02-12 ENCOUNTER — Other Ambulatory Visit (HOSPITAL_COMMUNITY): Payer: Self-pay

## 2022-02-12 ENCOUNTER — Encounter (INDEPENDENT_AMBULATORY_CARE_PROVIDER_SITE_OTHER): Payer: Self-pay | Admitting: Adult Health

## 2022-02-12 VITALS — BP 113/68 | HR 63 | Temp 98.0°F | Ht 64.0 in | Wt 189.0 lb

## 2022-02-12 DIAGNOSIS — F411 Generalized anxiety disorder: Secondary | ICD-10-CM

## 2022-02-12 DIAGNOSIS — Z7985 Long-term (current) use of injectable non-insulin antidiabetic drugs: Secondary | ICD-10-CM

## 2022-02-12 DIAGNOSIS — E8881 Metabolic syndrome: Secondary | ICD-10-CM | POA: Diagnosis not present

## 2022-02-12 DIAGNOSIS — E669 Obesity, unspecified: Secondary | ICD-10-CM

## 2022-02-12 DIAGNOSIS — Z6832 Body mass index (BMI) 32.0-32.9, adult: Secondary | ICD-10-CM

## 2022-02-12 DIAGNOSIS — Z6841 Body Mass Index (BMI) 40.0 and over, adult: Secondary | ICD-10-CM

## 2022-02-12 MED ORDER — WEGOVY 1.7 MG/0.75ML ~~LOC~~ SOAJ
1.7000 mg | SUBCUTANEOUS | 0 refills | Status: DC
  Filled 2022-02-12: qty 3, 28d supply, fill #0

## 2022-02-17 NOTE — Progress Notes (Addendum)
Chief Complaint:   OBESITY Shannon Wagner is here to discuss her progress with her obesity treatment plan along with follow-up of her obesity related diagnoses. Shannon Wagner is on practicing portion control and making smarter food choices, such as increasing vegetables and decreasing simple carbohydrates and states she is following her eating plan approximately 80% of the time. Shannon Wagner states she is at the club piliates 50 minutes 2-3 times per week.  Today's visit was #: 12 Starting weight: 236 lbs Starting date: 04/19/2021 Today's weight: 189 lbs Today's date: 02/12/2022 Total lbs lost to date: 47 lbs Total lbs lost since last in-office visit: 1 lb  Interim History:  Shannon Wagner states "I feel healthy and I am exercising".  Reviewed Bioimpedance results with pt: Muscle mass +4 lbs Adipose mass -6 lbs  Of note:  She will be travelling to [facility] [place]- hiking trip. Trip will have 11 participants.  Subjective:   1. Insulin resistance Shannon Wagner is currently on Wegovy 1.7 mg once a week. She denies mass in neck,dysphagia, dyspepsia, persistent hoarseness, abd pain, or N/V/Constipation.  2. Generalized anxiety disorder Shannon Wagner's mood is stable, she is on Fluoxetine 40 mg daily.  She denies SI/HI.  Assessment/Plan:   1. Insulin resistance Continue GLP-1 therapy as directed, see below.   - Semaglutide-Weight Management (WEGOVY) 1.7 MG/0.75ML SOAJ; Inject 1.7 mg into the skin once a week.  Dispense: 3 mL; Refill: 0  2. Generalized anxiety disorder Continue daily SSRI and regular exercise.   3. Obesity with current BMI of 32.4 Refill Wegovy 1.7 once weekly, dispense 3 mL, no refills, see below.  - Semaglutide-Weight Management (WEGOVY) 1.7 MG/0.75ML SOAJ; Inject 1.7 mg into the skin once a week.  Dispense: 3 mL; Refill: 0  Shannon Wagner is currently in the action stage of change. As such, her goal is to continue with weight loss efforts. She has agreed to practicing portion control and  making smarter food choices, such as increasing vegetables and decreasing simple carbohydrates.   Exercise goals:  As is.   Behavioral modification strategies: increasing lean protein intake, decreasing simple carbohydrates, meal planning and cooking strategies, keeping healthy foods in the home, and planning for success.  Shannon Wagner has agreed to follow-up with our clinic in 5 weeks. She was informed of the importance of frequent follow-up visits to maximize her success with intensive lifestyle modifications for her multiple health conditions.   Objective:   Blood pressure 113/68, pulse 63, temperature 98 F (36.7 C), height 5\' 4"  (1.626 m), weight 189 lb (85.7 kg), SpO2 98 %. Body mass index is 32.44 kg/m.  General: Cooperative, alert, well developed, in no acute distress. HEENT: Conjunctivae and lids unremarkable. Cardiovascular: Regular rhythm.  Lungs: Normal work of breathing. Neurologic: No focal deficits.   Lab Results  Component Value Date   CREATININE 0.82 12/13/2021   BUN 13 12/13/2021   NA 138 12/13/2021   K 4.6 12/13/2021   CL 101 12/13/2021   CO2 22 12/13/2021   Lab Results  Component Value Date   ALT 12 12/13/2021   AST 14 12/13/2021   ALKPHOS 62 12/13/2021   BILITOT 0.5 12/13/2021   Lab Results  Component Value Date   HGBA1C 4.9 12/13/2021   HGBA1C 5.1 04/19/2021   Lab Results  Component Value Date   INSULIN 2.3 (L) 12/13/2021   INSULIN 10.2 04/19/2021   Lab Results  Component Value Date   TSH 1.320 12/13/2021   Lab Results  Component Value Date   CHOL 170 12/13/2021  HDL 63 12/13/2021   LDLCALC 94 12/13/2021   TRIG 66 12/13/2021   CHOLHDL 2.7 12/13/2021   Lab Results  Component Value Date   VD25OH 92.4 12/13/2021   VD25OH 53.6 04/19/2021   VD25OH 29.04 (L) 09/28/2019   Lab Results  Component Value Date   WBC 8.6 01/29/2021   HGB 14.6 01/29/2021   HCT 43.2 01/29/2021   MCV 93.2 01/29/2021   PLT 329.0 01/29/2021   No results found  for: "IRON", "TIBC", "FERRITIN"   Attestation Statements:   Reviewed by clinician on day of visit: allergies, medications, problem list, medical history, surgical history, family history, social history, and previous encounter notes.  I, Malcolm Metro, RMA, am acting as Energy manager for William Hamburger, NP.  I have reviewed the above documentation for accuracy and completeness, and I agree with the above. -  Shannon Wagner d. Malkie Wille, NP-C

## 2022-03-13 ENCOUNTER — Other Ambulatory Visit (HOSPITAL_COMMUNITY): Payer: Self-pay

## 2022-03-13 ENCOUNTER — Other Ambulatory Visit (INDEPENDENT_AMBULATORY_CARE_PROVIDER_SITE_OTHER): Payer: Self-pay | Admitting: Adult Health

## 2022-03-13 ENCOUNTER — Other Ambulatory Visit: Payer: Self-pay | Admitting: Physician Assistant

## 2022-03-13 DIAGNOSIS — E8881 Metabolic syndrome: Secondary | ICD-10-CM

## 2022-03-13 MED ORDER — WEGOVY 1.7 MG/0.75ML ~~LOC~~ SOAJ
1.7000 mg | SUBCUTANEOUS | 0 refills | Status: DC
  Filled 2022-03-13: qty 3, 28d supply, fill #0

## 2022-03-13 MED ORDER — LEVOTHYROXINE SODIUM 175 MCG PO TABS
175.0000 ug | ORAL_TABLET | Freq: Every day | ORAL | 0 refills | Status: DC
Start: 2022-03-13 — End: 2022-06-17
  Filled 2022-03-13: qty 90, 90d supply, fill #0

## 2022-03-13 NOTE — Telephone Encounter (Signed)
LAST APPOINTMENT DATE: 02/12/22 NEXT APPOINTMENT DATE: 03/21/22   [facility] [address] [place] [address] Phone: [phone] Fax: [phone]  [id number] - [place] - [address] AT [address] [facility] [address] [place] [address] Phone: [phone] Fax: [phone]  [facility] [address] [place] [address] Phone: [phone] Fax: [phone]  Patient is requesting a refill of the following medications: Requested Prescriptions   Pending Prescriptions Disp Refills   Semaglutide-Weight Management (WEGOVY) 1.7 MG/0.75ML SOAJ 3 mL 0    Sig: Inject 1.7 mg into the skin once a week.    Date last filled: 02/12/22 Previously prescribed by William Hamburger  Lab Results  Component Value Date   HGBA1C 4.9 12/13/2021   HGBA1C 5.1 04/19/2021   Lab Results  Component Value Date   LDLCALC 94 12/13/2021   CREATININE 0.82 12/13/2021   Lab Results  Component Value Date   VD25OH 92.4 12/13/2021   VD25OH 53.6 04/19/2021   VD25OH 29.04 (L) 09/28/2019    BP Readings from Last 3 Encounters:  02/12/22 113/68  01/15/22 97/65  12/13/21 103/70

## 2022-03-13 NOTE — Telephone Encounter (Signed)
Mychart message sent.

## 2022-03-21 ENCOUNTER — Other Ambulatory Visit (HOSPITAL_COMMUNITY): Payer: Self-pay

## 2022-03-21 ENCOUNTER — Ambulatory Visit (INDEPENDENT_AMBULATORY_CARE_PROVIDER_SITE_OTHER): Payer: No Typology Code available for payment source | Admitting: Adult Health

## 2022-03-21 ENCOUNTER — Encounter (INDEPENDENT_AMBULATORY_CARE_PROVIDER_SITE_OTHER): Payer: Self-pay | Admitting: Adult Health

## 2022-03-21 VITALS — BP 103/70 | HR 77 | Temp 98.1°F | Ht 64.0 in | Wt 184.0 lb

## 2022-03-21 DIAGNOSIS — Z6831 Body mass index (BMI) 31.0-31.9, adult: Secondary | ICD-10-CM | POA: Diagnosis not present

## 2022-03-21 DIAGNOSIS — E669 Obesity, unspecified: Secondary | ICD-10-CM

## 2022-03-21 DIAGNOSIS — E559 Vitamin D deficiency, unspecified: Secondary | ICD-10-CM | POA: Diagnosis not present

## 2022-03-21 DIAGNOSIS — E8881 Metabolic syndrome: Secondary | ICD-10-CM

## 2022-03-21 MED ORDER — WEGOVY 1.7 MG/0.75ML ~~LOC~~ SOAJ
1.7000 mg | SUBCUTANEOUS | 0 refills | Status: DC
  Filled 2022-03-21 – 2022-04-06 (×2): qty 3, 28d supply, fill #0

## 2022-03-23 ENCOUNTER — Telehealth: Payer: No Typology Code available for payment source | Admitting: Family Medicine

## 2022-03-23 DIAGNOSIS — R110 Nausea: Secondary | ICD-10-CM | POA: Diagnosis not present

## 2022-03-23 MED ORDER — ONDANSETRON HCL 4 MG PO TABS: 4.0000 mg | ORAL_TABLET | Freq: Three times a day (TID) | ORAL | 0 refills | Status: DC | PRN

## 2022-03-23 NOTE — Progress Notes (Signed)
E-Visit for Nausea and Vomiting   We are sorry that you are not feeling well. Here is how we plan to help!  Based on what you have shared with me it looks like you have a Virus that is irritating your GI tract.  Vomiting is the forceful emptying of a portion of the stomach's content through the mouth.  Although nausea and vomiting can make you feel miserable, it's important to remember that these are not diseases, but rather symptoms of an underlying illness.  When we treat short term symptoms, we always caution that any symptoms that persist should be fully evaluated in a medical office.  I have prescribed a medication that will help alleviate your symptoms and allow you to stay hydrated:  Zofran 4 mg 1 tablet every 8 hours as needed for nausea and vomiting  HOME CARE: Drink clear liquids.  This is very important! Dehydration (the lack of fluid) can lead to a serious complication.  Start off with 1 tablespoon every 5 minutes for 8 hours. You may begin eating bland foods after 8 hours without vomiting.  Start with saltine crackers, white bread, rice, mashed potatoes, applesauce. After 48 hours on a bland diet, you may resume a normal diet. Try to go to sleep.  Sleep often empties the stomach and relieves the need to vomit.  GET HELP RIGHT AWAY IF:  Your symptoms do not improve or worsen within 2 days after treatment. You have a fever for over 3 days. You cannot keep down fluids after trying the medication.  MAKE SURE YOU:  Understand these instructions. Will watch your condition. Will get help right away if you are not doing well or get worse.    Thank you for choosing an e-visit.  Your e-visit answers were reviewed by a board certified advanced clinical practitioner to complete your personal care plan. Depending upon the condition, your plan could have included both over the counter or prescription medications.  Please review your pharmacy choice. Make sure the pharmacy is open so  you can pick up prescription now. If there is a problem, you may contact your provider through MyChart messaging and have the prescription routed to another pharmacy.  Your safety is important to us. If you have drug allergies check your prescription carefully.   For the next 24 hours you can use MyChart to ask questions about today's visit, request a non-urgent call back, or ask for a work or school excuse. You will get an email in the next two days asking about your experience. I hope that your e-visit has been valuable and will speed your recovery.   I have provided 5 minutes of non face to face time during this encounter for chart review and documentation.   

## 2022-03-25 NOTE — Progress Notes (Unsigned)
Chief Complaint:   OBESITY Shannon Wagner is here to discuss her progress with her obesity treatment plan along with follow-up of her obesity related diagnoses. Shannon Wagner is on practicing portion control and making smarter food choices, such as increasing vegetables and decreasing simple carbohydrates and states she is following her eating plan approximately 80% of the time. Shannon Wagner states she is doing pilates and exercise videos for 60 minutes 3 times per week.  Today's visit was #: 13 Starting weight: 236 lbs Starting date: 04/19/2021 Today's weight: 184 lbs Today's date: 03/21/2022 Total lbs lost to date: 52 Total lbs lost since last in-office visit: 5  Interim History: Shannon Wagner injects Wegovy 1.7 mg on Sunday, and she notes mild nausea without vomiting Sunday/Monday.***.  She will travel to [place], [place] next week.  Travel strategies were discussed.  Subjective:   1. Vitamin D deficiency On 12/13/2021, Shannon Wagner's vitamin D level was 92.4.  On 12/14/2021 she stopped all vitamin D supplementation.  She denies nausea, vomiting, or muscle weakness.  2. Insulin resistance Shannon Wagner is on Wegovy 1.7, and has excellent appetite control. If she goes long periods without eating, she can experience symptoms of hypoglycemia.   Assessment/Plan:   1. Vitamin D deficiency We will recheck fasting labs at Aika's next office visit.  2. Insulin resistance Shannon Wagner will continue Wegovy 1.7 mg once weekly, and we will refill for 1 month.  She is to eat every few hours.  We will recheck fasting labs at her next office visit.  - Semaglutide-Weight Management (WEGOVY) 1.7 MG/0.75ML SOAJ; Inject 1.7 mg into the skin once a week.  Dispense: 3 mL; Refill: 0  3. Obesity with current BMI of 31.7 Shannon Wagner is currently in the action stage of change. As such, her goal is to continue with weight loss efforts. She has agreed to practicing portion control and making smarter food choices, such as increasing  vegetables and decreasing simple carbohydrates.   We will recheck fasting labs at her next office visit.  We discussed various medication options to help Shannon Wagner with her weight loss efforts and we both agreed to continue Wegovy 1.7 mg once weekly, and we will refill for 1 month.  - Semaglutide-Weight Management (WEGOVY) 1.7 MG/0.75ML SOAJ; Inject 1.7 mg into the skin once a week.  Dispense: 3 mL; Refill: 0  Exercise goals: As is.   Behavioral modification strategies: increasing lean protein intake, decreasing simple carbohydrates, meal planning and cooking strategies, keeping healthy foods in the home, and planning for success.  Shannon Wagner has agreed to follow-up with our clinic in 4 weeks. She was informed of the importance of frequent follow-up visits to maximize her success with intensive lifestyle modifications for her multiple health conditions.   Objective:   Blood pressure 103/70, pulse 77, temperature 98.1 F (36.7 C), height 5\' 4"  (1.626 m), weight 184 lb (83.5 kg), SpO2 99 %. Body mass index is 31.58 kg/m.  General: Cooperative, alert, well developed, in no acute distress. HEENT: Conjunctivae and lids unremarkable. Cardiovascular: Regular rhythm.  Lungs: Normal work of breathing. Neurologic: No focal deficits.   Lab Results  Component Value Date   CREATININE 0.82 12/13/2021   BUN 13 12/13/2021   NA 138 12/13/2021   K 4.6 12/13/2021   CL 101 12/13/2021   CO2 22 12/13/2021   Lab Results  Component Value Date   ALT 12 12/13/2021   AST 14 12/13/2021   ALKPHOS 62 12/13/2021   BILITOT 0.5 12/13/2021   Lab Results  Component Value  Date   HGBA1C 4.9 12/13/2021   HGBA1C 5.1 04/19/2021   Lab Results  Component Value Date   INSULIN 2.3 (L) 12/13/2021   INSULIN 10.2 04/19/2021   Lab Results  Component Value Date   TSH 1.320 12/13/2021   Lab Results  Component Value Date   CHOL 170 12/13/2021   HDL 63 12/13/2021   LDLCALC 94 12/13/2021   TRIG 66 12/13/2021    CHOLHDL 2.7 12/13/2021   Lab Results  Component Value Date   VD25OH 92.4 12/13/2021   VD25OH 53.6 04/19/2021   VD25OH 29.04 (L) 09/28/2019   Lab Results  Component Value Date   WBC 8.6 01/29/2021   HGB 14.6 01/29/2021   HCT 43.2 01/29/2021   MCV 93.2 01/29/2021   PLT 329.0 01/29/2021   No results found for: "IRON", "TIBC", "FERRITIN"  Attestation Statements:   Reviewed by clinician on day of visit: allergies, medications, problem list, medical history, surgical history, family history, social history, and previous encounter notes.   Trude Mcburney, am acting as transcriptionist for William Hamburger, NP.  I have reviewed the above documentation for accuracy and completeness, and I agree with the above. -  ***

## 2022-04-06 ENCOUNTER — Other Ambulatory Visit (HOSPITAL_COMMUNITY): Payer: Self-pay

## 2022-04-08 ENCOUNTER — Other Ambulatory Visit (HOSPITAL_COMMUNITY): Payer: Self-pay

## 2022-04-17 ENCOUNTER — Encounter (INDEPENDENT_AMBULATORY_CARE_PROVIDER_SITE_OTHER): Payer: Self-pay

## 2022-04-22 ENCOUNTER — Ambulatory Visit (INDEPENDENT_AMBULATORY_CARE_PROVIDER_SITE_OTHER): Payer: No Typology Code available for payment source | Admitting: Adult Health

## 2022-04-22 ENCOUNTER — Other Ambulatory Visit (HOSPITAL_COMMUNITY): Payer: Self-pay

## 2022-04-22 ENCOUNTER — Encounter (INDEPENDENT_AMBULATORY_CARE_PROVIDER_SITE_OTHER): Payer: Self-pay | Admitting: Adult Health

## 2022-04-22 VITALS — BP 96/65 | HR 73 | Temp 98.0°F | Ht 64.0 in | Wt 182.0 lb

## 2022-04-22 DIAGNOSIS — E8881 Metabolic syndrome: Secondary | ICD-10-CM

## 2022-04-22 DIAGNOSIS — R110 Nausea: Secondary | ICD-10-CM

## 2022-04-22 DIAGNOSIS — E559 Vitamin D deficiency, unspecified: Secondary | ICD-10-CM

## 2022-04-22 DIAGNOSIS — E669 Obesity, unspecified: Secondary | ICD-10-CM | POA: Diagnosis not present

## 2022-04-22 DIAGNOSIS — Z6831 Body mass index (BMI) 31.0-31.9, adult: Secondary | ICD-10-CM

## 2022-04-22 MED ORDER — WEGOVY 1.7 MG/0.75ML ~~LOC~~ SOAJ
1.7000 mg | SUBCUTANEOUS | 0 refills | Status: DC
  Filled 2022-04-22 – 2022-05-04 (×2): qty 3, 28d supply, fill #0

## 2022-04-23 LAB — COMPREHENSIVE METABOLIC PANEL
ALT: 9 IU/L (ref 0–32)
AST: 10 IU/L (ref 0–40)
Albumin/Globulin Ratio: 1.8 (ref 1.2–2.2)
Albumin: 4.7 g/dL (ref 3.9–4.9)
Alkaline Phosphatase: 58 IU/L (ref 44–121)
BUN/Creatinine Ratio: 14 (ref 9–23)
BUN: 10 mg/dL (ref 6–24)
Bilirubin Total: 0.6 mg/dL (ref 0.0–1.2)
CO2: 16 mmol/L — ABNORMAL LOW (ref 20–29)
Calcium: 9.6 mg/dL (ref 8.7–10.2)
Chloride: 103 mmol/L (ref 96–106)
Creatinine, Ser: 0.7 mg/dL (ref 0.57–1.00)
Globulin, Total: 2.6 g/dL (ref 1.5–4.5)
Glucose: 76 mg/dL (ref 70–99)
Potassium: 5.3 mmol/L — ABNORMAL HIGH (ref 3.5–5.2)
Sodium: 141 mmol/L (ref 134–144)
Total Protein: 7.3 g/dL (ref 6.0–8.5)
eGFR: 109 mL/min/{1.73_m2} (ref >59)

## 2022-04-23 LAB — INSULIN, RANDOM: INSULIN: 9.5 u[IU]/mL (ref 2.6–24.9)

## 2022-04-23 LAB — VITAMIN B12: Vitamin B-12: 482 pg/mL (ref 232–1245)

## 2022-04-23 LAB — HEMOGLOBIN A1C
Est. average glucose Bld gHb Est-mCnc: 94 mg/dL
Hgb A1c MFr Bld: 4.9 % (ref 4.8–5.6)

## 2022-04-23 LAB — VITAMIN D 25 HYDROXY (VIT D DEFICIENCY, FRACTURES): Vit D, 25-Hydroxy: 53.5 ng/mL (ref 30.0–100.0)

## 2022-04-29 NOTE — Progress Notes (Unsigned)
Chief Complaint:   OBESITY Shannon Wagner is here to discuss her progress with her obesity treatment plan along with follow-up of her obesity related diagnoses. Shannon Wagner is on practicing portion control and making smarter food choices, such as increasing vegetables and decreasing simple carbohydrates and states she is following her eating plan approximately 80% of the time. Shannon Wagner states she is doing Pilates and walking 60 minutes 4 times per week.  Today's visit was #: 14 Starting weight: 236 lbs Starting date: 04/19/2021 Today's weight: 182 lbs Today's date: 04/22/2022 Total lbs lost to date: 54 lbs Total lbs lost since last in-office visit: 2  Interim History: Shannon Wagner notes: work days is -coffee with [employer], snack is--protein bar or yogurt, lunch is--deli meat, fruit, and string cheese, dinner decreased portion of family's dinner.  Subjective:   1. Nausea Shannon Wagner had an E visit on 03/23/22 for nausea. Provided with Zofran 4 mg every night. She used a "handful" of Zofran 4 mg dose. She denies any GI upset at present time.  2. Insulin resistance Shannon Wagner is currently on weekly Wegovy 1.7 mg--***  3. Vitamin D deficiency Shannon Wagner is currently taking over the counter Multivitamin.   Assessment/Plan:   1. Nausea ***  2. Insulin resistance We will obtain labs today.  - Comprehensive metabolic panel - Hemoglobin A1c - Insulin, random - Vitamin B12  3. Vitamin D deficiency We will obtain labs today. Shannon Wagner will continue taking daily Multivitamin.  - VITAMIN D 25 Hydroxy (Vit-D Deficiency, Fractures)  4. Obesity with current BMI of 31.3 We will refill Wegovy 1.7 mg once weekly for 1 month with 0 refills.  -Refill Semaglutide-Weight Management (WEGOVY) 1.7 MG/0.75ML SOAJ; Inject 1.7 mg into the skin once a week.  Dispense: 3 mL; Refill: 0  Shannon Wagner is currently in the action stage of change. As such, her goal is to continue with weight loss efforts. She has agreed to  practicing portion control and making smarter food choices, such as increasing vegetables and decreasing simple carbohydrates.   Exercise goals: As is.  Behavioral modification strategies: increasing lean protein intake, decreasing simple carbohydrates, meal planning and cooking strategies, keeping healthy foods in the home, and planning for success.  Shannon Wagner has agreed to follow-up with our clinic in 4 weeks. She was informed of the importance of frequent follow-up visits to maximize her success with intensive lifestyle modifications for her multiple health conditions.   Shannon Wagner was informed we would discuss her lab results at her next visit unless there is a critical issue that needs to be addressed sooner. Shannon Wagner agreed to keep her next visit at the agreed upon time to discuss these results.  Objective:   Blood pressure 96/65, pulse 73, temperature 98 F (36.7 C), height 5\' 4"  (1.626 m), weight 182 lb (82.6 kg), SpO2 98 %. Body mass index is 31.24 kg/m.  General: Cooperative, alert, well developed, in no acute distress. HEENT: Conjunctivae and lids unremarkable. Cardiovascular: Regular rhythm.  Lungs: Normal work of breathing. Neurologic: No focal deficits.   Lab Results  Component Value Date   CREATININE 0.70 04/22/2022   BUN 10 04/22/2022   NA 141 04/22/2022   K 5.3 (H) 04/22/2022   CL 103 04/22/2022   CO2 16 (L) 04/22/2022   Lab Results  Component Value Date   ALT 9 04/22/2022   AST 10 04/22/2022   ALKPHOS 58 04/22/2022   BILITOT 0.6 04/22/2022   Lab Results  Component Value Date   HGBA1C 4.9 04/22/2022   HGBA1C  4.9 12/13/2021   HGBA1C 5.1 04/19/2021   Lab Results  Component Value Date   INSULIN 9.5 04/22/2022   INSULIN 2.3 (L) 12/13/2021   INSULIN 10.2 04/19/2021   Lab Results  Component Value Date   TSH 1.320 12/13/2021   Lab Results  Component Value Date   CHOL 170 12/13/2021   HDL 63 12/13/2021   LDLCALC 94 12/13/2021   TRIG 66 12/13/2021    CHOLHDL 2.7 12/13/2021   Lab Results  Component Value Date   VD25OH 53.5 04/22/2022   VD25OH 92.4 12/13/2021   VD25OH 53.6 04/19/2021   Lab Results  Component Value Date   WBC 8.6 01/29/2021   HGB 14.6 01/29/2021   HCT 43.2 01/29/2021   MCV 93.2 01/29/2021   PLT 329.0 01/29/2021   No results found for: "IRON", "TIBC", "FERRITIN"  Attestation Statements:   Reviewed by clinician on day of visit: allergies, medications, problem list, medical history, surgical history, family history, social history, and previous encounter notes.  I, Bohdi Leeds, RMA, am acting as transcriptionist for William Hamburger, NP.  I have reviewed the above documentation for accuracy and completeness, and I agree with the above. -  ***

## 2022-05-04 ENCOUNTER — Other Ambulatory Visit (INDEPENDENT_AMBULATORY_CARE_PROVIDER_SITE_OTHER): Payer: Self-pay | Admitting: Adult Health

## 2022-05-04 DIAGNOSIS — F411 Generalized anxiety disorder: Secondary | ICD-10-CM

## 2022-05-06 ENCOUNTER — Other Ambulatory Visit (HOSPITAL_COMMUNITY): Payer: Self-pay

## 2022-05-20 ENCOUNTER — Ambulatory Visit (INDEPENDENT_AMBULATORY_CARE_PROVIDER_SITE_OTHER): Payer: No Typology Code available for payment source | Admitting: Adult Health

## 2022-05-20 ENCOUNTER — Encounter (INDEPENDENT_AMBULATORY_CARE_PROVIDER_SITE_OTHER): Payer: Self-pay | Admitting: Adult Health

## 2022-05-20 ENCOUNTER — Other Ambulatory Visit (HOSPITAL_COMMUNITY): Payer: Self-pay

## 2022-05-20 VITALS — BP 117/78 | HR 65 | Temp 98.0°F | Ht 64.0 in | Wt 184.0 lb

## 2022-05-20 DIAGNOSIS — E875 Hyperkalemia: Secondary | ICD-10-CM | POA: Diagnosis not present

## 2022-05-20 DIAGNOSIS — E669 Obesity, unspecified: Secondary | ICD-10-CM

## 2022-05-20 DIAGNOSIS — E559 Vitamin D deficiency, unspecified: Secondary | ICD-10-CM | POA: Diagnosis not present

## 2022-05-20 DIAGNOSIS — F411 Generalized anxiety disorder: Secondary | ICD-10-CM | POA: Diagnosis not present

## 2022-05-20 DIAGNOSIS — Z6831 Body mass index (BMI) 31.0-31.9, adult: Secondary | ICD-10-CM

## 2022-05-20 DIAGNOSIS — E8881 Metabolic syndrome: Secondary | ICD-10-CM | POA: Diagnosis not present

## 2022-05-20 DIAGNOSIS — Z6841 Body Mass Index (BMI) 40.0 and over, adult: Secondary | ICD-10-CM

## 2022-05-20 MED ORDER — FLUOXETINE HCL 40 MG PO CAPS
40.0000 mg | ORAL_CAPSULE | Freq: Every day | ORAL | 0 refills | Status: DC
  Filled 2022-05-20: qty 90, 90d supply, fill #0

## 2022-05-20 MED ORDER — WEGOVY 1.7 MG/0.75ML ~~LOC~~ SOAJ
1.7000 mg | SUBCUTANEOUS | 0 refills | Status: DC
  Filled 2022-05-20 – 2022-06-03 (×3): qty 3, 28d supply, fill #0

## 2022-05-22 DIAGNOSIS — E875 Hyperkalemia: Secondary | ICD-10-CM | POA: Insufficient documentation

## 2022-05-22 NOTE — Progress Notes (Signed)
Chief Complaint:   OBESITY Shannon Wagner is here to discuss her progress with her obesity treatment plan along with follow-up of her obesity related diagnoses. Shannon Wagner is on practicing portion control and making smarter food choices, such as increasing vegetables and decreasing simple carbohydrates and states she is following her eating plan approximately 75% of the time. Shannon Wagner states she is piliates 60 minutes 1 times per week.  Today's visit was #: 15 Starting weight: 236 lbs Starting date: 04/19/2021 Today's weight: 184 lbs Today's date: 05/20/2021 Total lbs lost to date: 52 lbs Total lbs lost since last in-office visit: +2 lbs  Interim History:  She has found herself snacking more on crackers, chips, and cookies.  She is on Wegovy 1.7 mg once weekly injection. She denies mass in neck, dysphagia, dyspepsia, persistent hoarseness, abdominal pain, or N/V/Constipation.   Reviewed Bioimpedance results with pt: Muscle mass +1.8 Adipose mass -0.2 lbs  Subjective:   1. Insulin resistance Discussed labs with patient today. 04/19/2022, Insulin level 9.5, A1c 4.9.  Levels stable. She is on Wegovy 1.7 mg once weekly injection. She denies mass in neck, dysphagia, dyspepsia, persistent hoarseness, abdominal pain, or N/V/Constipation.  2. Generalized anxiety disorder She reports stable mood, denies suicidal and homicidal ideations.  She is on daily fluoxetine 40mg .  3. Hyperkalemia Discussed labs with patient today. 04/19/2022, CMP, K+ level 5.3 with normal renal function.   She denies palpitations.  She is not on ACE or ARB.    4. Vitamin D deficiency Discussed labs with patient today. 04/19/2022, Vitamin D level 53.5 stable.   Only on daily OTC multivitamin.   Assessment/Plan:   1. Insulin resistance Continue Wegovy therapy.   Refill - Semaglutide-Weight Management (WEGOVY) 1.7 MG/0.75ML SOAJ; Inject 1.7 mg into the skin once a week.  Dispense: 3 mL; Refill: 0  2.  Generalized anxiety disorder Refill - FLUoxetine (PROZAC) 40 MG capsule; Take 1 capsule (40 mg total) by mouth daily.  Dispense: 90 capsule; Refill: 0  3. Hyperkalemia Decrease potassium in diet.    4. Vitamin D deficiency Continue OTC multivitamin.   5. Obesity with current BMI of 31.6 Refill - Semaglutide-Weight Management (WEGOVY) 1.7 MG/0.75ML SOAJ; Inject 1.7 mg into the skin once a week.  Dispense: 3 mL; Refill: 0  Shannon Wagner is currently in the action stage of change. As such, her goal is to continue with weight loss efforts. She has agreed to practicing portion control and making smarter food choices, such as increasing vegetables and decreasing simple carbohydrates.   Exercise goals:  as is.    Behavioral modification strategies: increasing lean protein intake, decreasing simple carbohydrates, meal planning and cooking strategies, keeping healthy foods in the home, and planning for success.  Shannon Wagner has agreed to follow-up with our clinic in 2 weeks. She was informed of the importance of frequent follow-up visits to maximize her success with intensive lifestyle modifications for her multiple health conditions.   Objective:   Blood pressure 117/78, pulse 65, temperature 98 F (36.7 C), height 5\' 4"  (1.626 m), weight 184 lb (83.5 kg), SpO2 99 %. Body mass index is 31.58 kg/m.  General: Cooperative, alert, well developed, in no acute distress. HEENT: Conjunctivae and lids unremarkable. Cardiovascular: Regular rhythm.  Lungs: Normal work of breathing. Neurologic: No focal deficits.   Lab Results  Component Value Date   CREATININE 0.70 04/22/2022   BUN 10 04/22/2022   NA 141 04/22/2022   K 5.3 (H) 04/22/2022   CL 103 04/22/2022  CO2 16 (L) 04/22/2022   Lab Results  Component Value Date   ALT 9 04/22/2022   AST 10 04/22/2022   ALKPHOS 58 04/22/2022   BILITOT 0.6 04/22/2022   Lab Results  Component Value Date   HGBA1C 4.9 04/22/2022   HGBA1C 4.9 12/13/2021    HGBA1C 5.1 04/19/2021   Lab Results  Component Value Date   INSULIN 9.5 04/22/2022   INSULIN 2.3 (L) 12/13/2021   INSULIN 10.2 04/19/2021   Lab Results  Component Value Date   TSH 1.320 12/13/2021   Lab Results  Component Value Date   CHOL 170 12/13/2021   HDL 63 12/13/2021   LDLCALC 94 12/13/2021   TRIG 66 12/13/2021   CHOLHDL 2.7 12/13/2021   Lab Results  Component Value Date   VD25OH 53.5 04/22/2022   VD25OH 92.4 12/13/2021   VD25OH 53.6 04/19/2021   Lab Results  Component Value Date   WBC 8.6 01/29/2021   HGB 14.6 01/29/2021   HCT 43.2 01/29/2021   MCV 93.2 01/29/2021   PLT 329.0 01/29/2021   No results found for: "IRON", "TIBC", "FERRITIN"  Attestation Statements:   Reviewed by clinician on day of visit: allergies, medications, problem list, medical history, surgical history, family history, social history, and previous encounter notes.  I, Malcolm Metro, RMA, am acting as Energy manager for William Hamburger, NP.  I have reviewed the above documentation for accuracy and completeness, and I agree with the above. -  Despina Boan d. Inaaya Vellucci, NP-C

## 2022-06-03 ENCOUNTER — Encounter: Payer: Self-pay | Admitting: *Deleted

## 2022-06-03 ENCOUNTER — Encounter (INDEPENDENT_AMBULATORY_CARE_PROVIDER_SITE_OTHER): Payer: Self-pay

## 2022-06-03 ENCOUNTER — Telehealth (INDEPENDENT_AMBULATORY_CARE_PROVIDER_SITE_OTHER): Payer: Self-pay | Admitting: Adult Health

## 2022-06-03 ENCOUNTER — Other Ambulatory Visit (HOSPITAL_COMMUNITY): Payer: Self-pay

## 2022-06-03 NOTE — Telephone Encounter (Signed)
Shannon Wagner - Prior authorization approved for [employer]. Effective: 06/03/2022 to 06/03/2023. Patient sent approval message via mychart.

## 2022-06-17 ENCOUNTER — Other Ambulatory Visit: Payer: Self-pay | Admitting: Physician Assistant

## 2022-06-17 ENCOUNTER — Ambulatory Visit (INDEPENDENT_AMBULATORY_CARE_PROVIDER_SITE_OTHER): Payer: No Typology Code available for payment source | Admitting: Adult Health

## 2022-06-17 ENCOUNTER — Encounter (INDEPENDENT_AMBULATORY_CARE_PROVIDER_SITE_OTHER): Payer: Self-pay | Admitting: Adult Health

## 2022-06-17 ENCOUNTER — Other Ambulatory Visit (HOSPITAL_COMMUNITY): Payer: Self-pay

## 2022-06-17 DIAGNOSIS — Z6831 Body mass index (BMI) 31.0-31.9, adult: Secondary | ICD-10-CM

## 2022-06-17 DIAGNOSIS — E669 Obesity, unspecified: Secondary | ICD-10-CM | POA: Diagnosis not present

## 2022-06-17 DIAGNOSIS — E88819 Insulin resistance, unspecified: Secondary | ICD-10-CM | POA: Diagnosis not present

## 2022-06-17 DIAGNOSIS — Z6841 Body Mass Index (BMI) 40.0 and over, adult: Secondary | ICD-10-CM

## 2022-06-17 MED ORDER — WEGOVY 1.7 MG/0.75ML ~~LOC~~ SOAJ
1.7000 mg | SUBCUTANEOUS | 0 refills | Status: DC
  Filled 2022-06-17: qty 3, 28d supply, fill #0

## 2022-06-17 MED ORDER — LEVOTHYROXINE SODIUM 175 MCG PO TABS
175.0000 ug | ORAL_TABLET | Freq: Every day | ORAL | 0 refills | Status: DC
  Filled 2022-06-17: qty 90, 90d supply, fill #0

## 2022-06-17 MED ORDER — WEGOVY 1.7 MG/0.75ML ~~LOC~~ SOAJ
1.7000 mg | SUBCUTANEOUS | 2 refills | Status: DC
  Filled 2022-06-17 – 2022-06-28 (×2): qty 3, 28d supply, fill #0
  Filled 2022-07-25: qty 3, 28d supply, fill #1

## 2022-06-18 ENCOUNTER — Other Ambulatory Visit (HOSPITAL_COMMUNITY): Payer: Self-pay

## 2022-06-18 MED ORDER — INFLUENZA VAC SPLIT QUAD 0.5 ML IM SUSY
PREFILLED_SYRINGE | INTRAMUSCULAR | 0 refills | Status: DC
  Filled 2022-06-18: qty 0.5, 1d supply, fill #0

## 2022-06-20 ENCOUNTER — Other Ambulatory Visit: Payer: Self-pay | Admitting: Physician Assistant

## 2022-06-20 DIAGNOSIS — Z1231 Encounter for screening mammogram for malignant neoplasm of breast: Secondary | ICD-10-CM

## 2022-06-24 NOTE — Progress Notes (Unsigned)
Chief Complaint:   OBESITY Shannon Wagner is here to discuss her progress with her obesity treatment plan along with follow-up of her obesity related diagnoses. Shannon Wagner is on practicing portion control and making smarter food choices, such as increasing vegetables and decreasing simple carbohydrates and states she is following her eating plan approximately 80% of the time. Elly states she is doing piliates and walking 60 minutes 2 times per week.  Today's visit was #: 16 Starting weight: 236 lbs Starting date: 04/19/2021 Today's weight: 184 lbs Today's date: 06/17/2022 Total lbs lost to date: 52 lb Total lbs lost since last in-office visit: 1 lb  Interim History: She is going to club piliates weekly.  Current weight 183 lbs, goal weight is 175 lbs.    Subjective:   1. Insulin resistance ***  Assessment/Plan:   1. Insulin resistance Refill - Semaglutide-Weight Management (WEGOVY) 1.7 MG/0.75ML SOAJ; Inject 1.7 mg into the skin once a week.  Dispense: 3 mL; Refill: 2  2. Obesity with current BMI of 31.4 Refill - Semaglutide-Weight Management (WEGOVY) 1.7 MG/0.75ML SOAJ; Inject 1.7 mg into the skin once a week.  Dispense: 3 mL; Refill: 2  Shannon Wagner is currently in the action stage of change. As such, her goal is to continue with weight loss efforts. She has agreed to practicing portion control and making smarter food choices, such as increasing vegetables and decreasing simple carbohydrates.   Exercise goals:  As is.  Behavioral modification strategies: increasing lean protein intake, decreasing simple carbohydrates, meal planning and cooking strategies, keeping healthy foods in the home, and keeping a strict food journal.  Shannon Wagner has agreed to follow-up with our clinic in 8 weeks. She was informed of the importance of frequent follow-up visits to maximize her success with intensive lifestyle modifications for her multiple health conditions.   Objective:   Blood pressure  108/73, pulse 63, temperature 98 F (36.7 C), height 5\' 4"  (1.626 m), weight 183 lb (83 kg), SpO2 98 %. Body mass index is 31.41 kg/m.  General: Cooperative, alert, well developed, in no acute distress. HEENT: Conjunctivae and lids unremarkable. Cardiovascular: Regular rhythm.  Lungs: Normal work of breathing. Neurologic: No focal deficits.   Lab Results  Component Value Date   CREATININE 0.70 04/22/2022   BUN 10 04/22/2022   NA 141 04/22/2022   K 5.3 (H) 04/22/2022   CL 103 04/22/2022   CO2 16 (L) 04/22/2022   Lab Results  Component Value Date   ALT 9 04/22/2022   AST 10 04/22/2022   ALKPHOS 58 04/22/2022   BILITOT 0.6 04/22/2022   Lab Results  Component Value Date   HGBA1C 4.9 04/22/2022   HGBA1C 4.9 12/13/2021   HGBA1C 5.1 04/19/2021   Lab Results  Component Value Date   INSULIN 9.5 04/22/2022   INSULIN 2.3 (L) 12/13/2021   INSULIN 10.2 04/19/2021   Lab Results  Component Value Date   TSH 1.320 12/13/2021   Lab Results  Component Value Date   CHOL 170 12/13/2021   HDL 63 12/13/2021   LDLCALC 94 12/13/2021   TRIG 66 12/13/2021   CHOLHDL 2.7 12/13/2021   Lab Results  Component Value Date   VD25OH 53.5 04/22/2022   VD25OH 92.4 12/13/2021   VD25OH 53.6 04/19/2021   Lab Results  Component Value Date   WBC 8.6 01/29/2021   HGB 14.6 01/29/2021   HCT 43.2 01/29/2021   MCV 93.2 01/29/2021   PLT 329.0 01/29/2021   No results found for: "IRON", "TIBC", "  FERRITIN"  Attestation Statements:   Reviewed by clinician on day of visit: allergies, medications, problem list, medical history, surgical history, family history, social history, and previous encounter notes.  I, Davy Pique, RMA, am acting as Location manager for Mina Marble, NP.  I have reviewed the above documentation for accuracy and completeness, and I agree with the above. -  ***

## 2022-06-28 ENCOUNTER — Other Ambulatory Visit (HOSPITAL_COMMUNITY): Payer: Self-pay

## 2022-07-02 ENCOUNTER — Encounter: Payer: Self-pay | Admitting: Physician Assistant

## 2022-07-02 ENCOUNTER — Other Ambulatory Visit (HOSPITAL_COMMUNITY): Payer: Self-pay

## 2022-07-02 ENCOUNTER — Ambulatory Visit (INDEPENDENT_AMBULATORY_CARE_PROVIDER_SITE_OTHER): Payer: No Typology Code available for payment source | Admitting: Physician Assistant

## 2022-07-02 VITALS — BP 110/70 | HR 70 | Temp 97.7°F | Ht 64.0 in | Wt 187.5 lb

## 2022-07-02 DIAGNOSIS — Z0000 Encounter for general adult medical examination without abnormal findings: Secondary | ICD-10-CM

## 2022-07-02 DIAGNOSIS — E038 Other specified hypothyroidism: Secondary | ICD-10-CM

## 2022-07-02 DIAGNOSIS — F321 Major depressive disorder, single episode, moderate: Secondary | ICD-10-CM

## 2022-07-02 DIAGNOSIS — F419 Anxiety disorder, unspecified: Secondary | ICD-10-CM | POA: Diagnosis not present

## 2022-07-02 DIAGNOSIS — Z30432 Encounter for removal of intrauterine contraceptive device: Secondary | ICD-10-CM | POA: Diagnosis not present

## 2022-07-02 DIAGNOSIS — E669 Obesity, unspecified: Secondary | ICD-10-CM | POA: Diagnosis not present

## 2022-07-02 MED ORDER — LEVOTHYROXINE SODIUM 175 MCG PO TABS
175.0000 ug | ORAL_TABLET | Freq: Every day | ORAL | 0 refills | Status: DC
  Filled 2022-07-02: qty 90, 90d supply, fill #0

## 2022-07-02 NOTE — Progress Notes (Signed)
Subjective:    Shannon Wagner is a 46 y.o. female and is here for a comprehensive physical exam.  HPI   Health Maintenance Due  Topic Date Due   MAMMOGRAM  05/18/2021    Acute Concerns: None  Chronic Issues: Obesity--  She has been going to weight management. She receives 1.7 mg Wegovy, and states that it has been helping her.  Wt Readings from Last 3 Encounters:  07/02/22 187 lb 8 oz (85 kg)  06/17/22 183 lb (83 kg)  05/20/22 184 lb (83.5 kg)    Health Maintenance: Social History -- She denies any changes to family history. Her mother had Bile duct cancer. She denies family history of colon cancer. She is a social drinker. She denies cigarettes and other tobacco products.   Mammogram -- Last completed on 05/18/2021.   Diet -- She maintains a healthy diet.   Exercise -- She regularly exercises.   Sleep habits -- She notes that she is sleeping well Mood -- She notes her mood has been better with Prozac 40 mg.  UTD with dentist? - She is UTD with dentist. UTD with eye doctor? - She is not UTD with eye doctor.  UTD with GYN: Yes, but would like IUD removal.  Weight history: Wt Readings from Last 10 Encounters:  07/02/22 187 lb 8 oz (85 kg)  06/17/22 183 lb (83 kg)  05/20/22 184 lb (83.5 kg)  04/22/22 182 lb (82.6 kg)  03/21/22 184 lb (83.5 kg)  02/12/22 189 lb (85.7 kg)  01/15/22 190 lb (86.2 kg)  12/13/21 190 lb (86.2 kg)  11/19/21 196 lb (88.9 kg)  10/18/21 199 lb (90.3 kg)   Body mass index is 32.18 kg/m. Patient's last menstrual period was 06/24/2022 (exact date).  Alcohol use:  reports current alcohol use of about 3.0 standard drinks of alcohol per week.  Tobacco use:  Tobacco Use: Low Risk  (07/02/2022)   Patient History    Smoking Tobacco Use: Never    Smokeless Tobacco Use: Never    Passive Exposure: Not on file   Eligible for lung cancer screening? No     07/02/2022    1:05 PM  Depression screen PHQ 2/9  Decreased Interest 0  Down,  Depressed, Hopeless 0  PHQ - 2 Score 0     Other providers/specialists: Patient Care Team: Inda Coke, [place] as PCP - General (Physician Assistant)    PMHx, SurgHx, SocialHx, Medications, and Allergies were reviewed in the Visit Navigator and updated as appropriate.   Past Medical History:  Diagnosis Date   Acquired hypothyroidism 04/10/2017   Anxiety    Back pain    Depression    Joint pain    Obesity (BMI 30-39.9) 04/10/2017   Other fatigue    SOB (shortness of breath) on exertion    Vitamin D deficiency     History reviewed. No pertinent surgical history.   Family History  Problem Relation Age of Onset   Cancer - Other Mother        Bile Duct   Liver disease Mother    Obesity Mother    Heart failure Father    Hypertension Father    Diabetes Father    Heart disease Father    Breast cancer Maternal Grandmother    Heart disease Paternal Grandmother    Heart disease Paternal Grandfather    Breast cancer Maternal Aunt     Social History   Tobacco Use   Smoking status: Never  Smokeless tobacco: Never  Vaping Use   Vaping Use: Never used  Substance Use Topics   Alcohol use: Yes    Alcohol/week: 3.0 standard drinks of alcohol    Types: 3 Standard drinks or equivalent per week    Comment: social    Drug use: No    Review of Systems:   Review of Systems  Constitutional:  Negative for chills, fever, malaise/fatigue and weight loss.  HENT:  Negative for hearing loss, sinus pain and sore throat.   Respiratory:  Negative for cough and hemoptysis.   Cardiovascular:  Negative for chest pain, palpitations, leg swelling and PND.  Gastrointestinal:  Negative for abdominal pain, constipation, diarrhea, heartburn, nausea and vomiting.  Genitourinary:  Negative for dysuria, frequency and urgency.  Musculoskeletal:  Negative for back pain, myalgias and neck pain.  Skin:  Negative for itching and rash.  Neurological:  Negative for dizziness, tingling, seizures and  headaches.  Endo/Heme/Allergies:  Negative for polydipsia.  Psychiatric/Behavioral:  Negative for depression. The patient is not nervous/anxious.     Objective:   BP 110/70 (BP Location: Left Arm, Patient Position: Sitting, Cuff Size: Large)   Pulse 70   Temp 97.7 F (36.5 C) (Temporal)   Ht 5\' 4"  (1.626 m)   Wt 187 lb 8 oz (85 kg)   LMP 06/24/2022 (Exact Date)   SpO2 97%   BMI 32.18 kg/m  Body mass index is 32.18 kg/m.   General Appearance:    Alert, cooperative, no distress, appears stated age  Head:    Normocephalic, without obvious abnormality, atraumatic  Eyes:    PERRL, conjunctiva/corneas clear, EOM's intact, fundi    benign, both eyes  Ears:    Normal TM's and external ear canals, both ears  Nose:   Nares normal, septum midline, mucosa normal, no drainage    or sinus tenderness  Throat:   Lips, mucosa, and tongue normal; teeth and gums normal  Neck:   Supple, symmetrical, trachea midline, no adenopathy;    thyroid:  no enlargement/tenderness/nodules; no carotid   bruit or JVD  Back:     Symmetric, no curvature, ROM normal, no CVA tenderness  Lungs:     Clear to auscultation bilaterally, respirations unlabored  Chest Wall:    No tenderness or deformity   Heart:    Regular rate and rhythm, S1 and S2 normal, no murmur, rub or gallop  Breast Exam:    Deferred  Abdomen:     Soft, non-tender, bowel sounds active all four quadrants,    no masses, no organomegaly  Genitalia:    Deferred  Extremities:   Extremities normal, atraumatic, no cyanosis or edema  Pulses:   2+ and symmetric all extremities  Skin:   Skin color, texture, turgor normal, no rashes or lesions  Lymph nodes:   Cervical, supraclavicular, and axillary nodes normal  Neurologic:   CNII-XII intact, normal strength, sensation and reflexes    throughout    Assessment/Plan:   Routine physical examination Today patient counseled on age appropriate routine health concerns for screening and prevention, each  reviewed and up to date or declined. Immunizations reviewed and up to date or declined. Labs reviewed from prior provider. Risk factors for depression reviewed and negative. Hearing function and visual acuity are intact. ADLs screened and addressed as needed. Functional ability and level of safety reviewed and appropriate. Education, counseling and referrals performed based on assessed risks today. Patient provided with a copy of personalized plan for preventive services.  Encounter for  IUD removal Referral to gyn  Obesity, unspecified classification, unspecified obesity type, unspecified whether serious comorbidity present Continue weight mgmt  Anxiety; Depression, major, single episode, moderate (HCC) Well controlled Continue prozac 40 mg daily  Hypothyroidism Currently stable on 175 mcg levothyroxine Recommend follow-up in 6 months for repeat TSH with [name] or [facility]   I,Param Shah,acting as a scribe for [employer], PA.,have documented all relevant documentation on the behalf of Jarold Motto, PA,as directed by  Jarold Motto, PA while in the presence of Jarold Motto, [place].  I, Jarold Motto, [place], have reviewed all documentation for this visit. The documentation on 07/02/22 for the exam, diagnosis, procedures, and orders are all accurate and complete.   Jarold Motto, PA-C [facility]

## 2022-07-02 NOTE — Patient Instructions (Signed)
It was great to see you!  I will place referral for gynecology to discuss IUD removal.  When you turn 47 let's get a cologuard.  Update your thyroid in 6 months, with either [name] or [place].  Take care,  Shannon Wagner

## 2022-07-03 ENCOUNTER — Encounter: Payer: No Typology Code available for payment source | Admitting: Physician Assistant

## 2022-07-25 ENCOUNTER — Other Ambulatory Visit (HOSPITAL_COMMUNITY): Payer: Self-pay

## 2022-07-25 DIAGNOSIS — Z1231 Encounter for screening mammogram for malignant neoplasm of breast: Secondary | ICD-10-CM

## 2022-07-26 ENCOUNTER — Encounter: Payer: Self-pay | Admitting: Radiology

## 2022-07-26 ENCOUNTER — Ambulatory Visit: Payer: No Typology Code available for payment source | Admitting: Radiology

## 2022-07-26 VITALS — BP 118/82 | Ht 64.0 in | Wt 186.0 lb

## 2022-07-26 DIAGNOSIS — Z30432 Encounter for removal of intrauterine contraceptive device: Secondary | ICD-10-CM

## 2022-07-26 NOTE — Progress Notes (Signed)
   Shannon Wagner [birth year] [id number]   History:  46 y.o. G2P2 presents for removal of Paragard IUD.  Reason for discontinuation: vasectomy Pt has been counseled about risks and benefits as well as complications.  Consent is obtained today.  Patient's last menstrual period was 07/18/2022 (exact date).   Past medical history, past surgical history, family history and social history were all reviewed and documented in the EPIC chart.  ROS:  A ROS was performed and pertinent positives and negatives are included.  Exam: Vitals:   07/26/22 0941  BP: 118/82  Weight: 186 lb (84.4 kg)  Height: 5\' 4"  (1.626 m)   Body mass index is 31.93 kg/m.  Pelvic exam: Vulva:  normal female genitalia Vagina:  normal vagina, no discharge, exudate, lesion, or erythema Cervix:  Non-tender, Negative CMT, no lesions or redness. IUD strings visualized. Uterus:  normal shape, position and consistency    Procedure:  Speculum inserted.   Cervix visualized, IUD grasps with forceps and removed without difficulty. Pt tolerated procedure well.  Chaperone, , CMA was present for the entirety of the procedure   Assessment/Plan:  Removal of Paragard IUD             New contraceptive: Vasectomy   Schedule AEX Pt aware to call for any concerns, return to office as schedule for annual exam or as needed.   Raynelle Fanning WHNP-BC, 9:55 AM 07/26/2022

## 2022-07-29 ENCOUNTER — Other Ambulatory Visit (HOSPITAL_COMMUNITY): Payer: Self-pay

## 2022-08-12 ENCOUNTER — Other Ambulatory Visit (HOSPITAL_COMMUNITY): Payer: Self-pay

## 2022-08-12 ENCOUNTER — Encounter (INDEPENDENT_AMBULATORY_CARE_PROVIDER_SITE_OTHER): Payer: Self-pay | Admitting: Physician Assistant

## 2022-08-12 ENCOUNTER — Ambulatory Visit (INDEPENDENT_AMBULATORY_CARE_PROVIDER_SITE_OTHER): Payer: No Typology Code available for payment source | Admitting: Physician Assistant

## 2022-08-12 VITALS — BP 110/77 | HR 62 | Temp 98.2°F | Ht 64.0 in | Wt 184.0 lb

## 2022-08-12 DIAGNOSIS — E88819 Insulin resistance, unspecified: Secondary | ICD-10-CM | POA: Diagnosis not present

## 2022-08-12 DIAGNOSIS — E66813 Obesity, class 3: Secondary | ICD-10-CM

## 2022-08-12 DIAGNOSIS — E669 Obesity, unspecified: Secondary | ICD-10-CM | POA: Diagnosis not present

## 2022-08-12 DIAGNOSIS — F411 Generalized anxiety disorder: Secondary | ICD-10-CM

## 2022-08-12 DIAGNOSIS — E038 Other specified hypothyroidism: Secondary | ICD-10-CM | POA: Diagnosis not present

## 2022-08-12 DIAGNOSIS — Z6831 Body mass index (BMI) 31.0-31.9, adult: Secondary | ICD-10-CM

## 2022-08-12 MED ORDER — WEGOVY 1.7 MG/0.75ML ~~LOC~~ SOAJ
1.7000 mg | SUBCUTANEOUS | 2 refills | Status: DC
  Filled 2022-08-12: qty 3, 28d supply, fill #0
  Filled 2022-09-19: qty 3, 28d supply, fill #1

## 2022-08-12 MED ORDER — LEVOTHYROXINE SODIUM 175 MCG PO TABS
175.0000 ug | ORAL_TABLET | Freq: Every day | ORAL | 0 refills | Status: DC
  Filled 2022-08-12 – 2022-09-17 (×2): qty 90, 90d supply, fill #0

## 2022-08-12 MED ORDER — FLUOXETINE HCL 40 MG PO CAPS
40.0000 mg | ORAL_CAPSULE | Freq: Every day | ORAL | 0 refills | Status: DC
  Filled 2022-08-12: qty 90, 90d supply, fill #0

## 2022-08-13 ENCOUNTER — Other Ambulatory Visit (HOSPITAL_COMMUNITY): Payer: Self-pay

## 2022-08-16 ENCOUNTER — Other Ambulatory Visit (HOSPITAL_COMMUNITY): Payer: Self-pay

## 2022-08-26 NOTE — Progress Notes (Signed)
Chief Complaint:   OBESITY Shannon Wagner is here to discuss her progress with her obesity treatment plan along with follow-up of her obesity related diagnoses. Shannon Wagner is practicing portion control and making smarter food choices, such as increasing vegetables and decreasing simple carbohydrates and states she is following her eating plan approximately 75% of the time. Shannon Wagner states she is doing Pilates 60 minutes 1 times per week.  Today's visit was #: 79 Starting weight: 236 lbs Starting date: 04/19/2021 Today's weight: 184 lbs Today's date: 08/12/2022 Total lbs lost to date: 52 lbs Total lbs lost since last in-office visit: 0  Interim History: Shannon Wagner has done well with weight loss.  Eating at least 90 grams of protein and working on portion [employer].  Feels she is ready to maintain and has done  well going longer between appointments.   No side effects with Wegovy and appropriate appetite/hunger.  Subjective:   1. Generalized anxiety disorder Currently taking Prozac 40 mg daily with no side effects.  2. Insulin resistance Shannon Wagner is working on decreasing simple carbs, healthy eating, to promote weight loss.  Last A1c 4.9/insulin 9.5 on 04/23/22.  Taking GLP-1+ tolerating well with no side effects. Ready to begin maintenance with [facility] at this point.   3. Other specified hypothyroidism Shannon Wagner is taking Synthroid 175 mcg daily with no side effects, her last TSH 1.320 on 12/13/2021.  Assessment/Plan:   1. Generalized anxiety disorder We will refill Prozac 40 mg daily for 3 months with 0 refills. Continue healthy eating plan and exercise.  -Refill FLUoxetine (PROZAC) 40 MG capsule; Take 1 capsule (40 mg total) by mouth daily.  Dispense: 90 capsule; Refill: 0  2. Insulin resistance Continue healthy eating plan and exercise and GLP-1 (Wegovy 1.7 mg weekly).  3. Other specified hypothyroidism We will refill Synthroid 175 mcg daily for 3 months with 0 refills.  Continue healthy eating plan and exercise.  -Refill levothyroxine (SYNTHROID) 175 MCG tablet; Take 1 tablet (175 mcg total) by mouth daily before breakfast.  Dispense: 90 tablet; Refill: 0  4. Obesity with current BMI of 31.6 We will refill Wegovy 1.7 mg SQ once a week for 3 months with 0 refills.  -Refill Semaglutide-Weight Management (WEGOVY) 1.7 MG/0.75ML SOAJ; Inject 1.7 mg into the skin once a week.  Dispense: 3 mL; Refill: 2  Shannon Wagner is ready to work on [employer] maintenance with [facility].   She has agreed to practicing portion control and making smarter food choices, such as increasing vegetables and decreasing simple carbohydrates.   Exercise goals: As is.  Behavioral modification strategies: increasing lean protein intake and decreasing simple carbohydrates.  Shannon Wagner has agreed to follow-up with our clinic in 12 weeks. She was informed of the importance of frequent follow-up visits to maximize her success with intensive lifestyle modifications for her multiple health conditions.   Objective:   Blood pressure 110/77, pulse 62, temperature 98.2 F (36.8 C), height 5\' 4"  (1.626 m), weight 184 lb (83.5 kg), last menstrual period 07/18/2022, SpO2 100 %. Body mass index is 31.58 kg/m.  General: Cooperative, alert, well developed, in no acute distress. HEENT: Conjunctivae and lids unremarkable. Cardiovascular: Regular rhythm.  Lungs: Normal work of breathing. Neurologic: No focal deficits.   Lab Results  Component Value Date   CREATININE 0.70 04/22/2022   BUN 10 04/22/2022   NA 141 04/22/2022   K 5.3 (H) 04/22/2022   CL 103 04/22/2022   CO2 16 (L) 04/22/2022   Lab Results  Component Value Date  ALT 9 04/22/2022   AST 10 04/22/2022   ALKPHOS 58 04/22/2022   BILITOT 0.6 04/22/2022   Lab Results  Component Value Date   HGBA1C 4.9 04/22/2022   HGBA1C 4.9 12/13/2021   HGBA1C 5.1 04/19/2021   Lab Results  Component Value Date   INSULIN 9.5 04/22/2022   INSULIN 2.3 (L)  12/13/2021   INSULIN 10.2 04/19/2021   Lab Results  Component Value Date   TSH 1.320 12/13/2021   Lab Results  Component Value Date   CHOL 170 12/13/2021   HDL 63 12/13/2021   LDLCALC 94 12/13/2021   TRIG 66 12/13/2021   CHOLHDL 2.7 12/13/2021   Lab Results  Component Value Date   VD25OH 53.5 04/22/2022   VD25OH 92.4 12/13/2021   VD25OH 53.6 04/19/2021   Lab Results  Component Value Date   WBC 8.6 01/29/2021   HGB 14.6 01/29/2021   HCT 43.2 01/29/2021   MCV 93.2 01/29/2021   PLT 329.0 01/29/2021   No results found for: "IRON", "TIBC", "FERRITIN"  Attestation Statements:   Reviewed by clinician on day of visit: allergies, medications, problem list, medical history, surgical history, family history, social history, and previous encounter notes.  I, Brendell Tyus, am acting as transcriptionist for [employer], PA.  I have reviewed the above documentation for accuracy and completeness, and I agree with the above. -  Jourdin Connors,PA-C

## 2022-09-17 ENCOUNTER — Other Ambulatory Visit (HOSPITAL_COMMUNITY): Payer: Self-pay

## 2022-09-20 ENCOUNTER — Encounter (INDEPENDENT_AMBULATORY_CARE_PROVIDER_SITE_OTHER): Payer: Self-pay | Admitting: Adult Health

## 2022-09-24 ENCOUNTER — Ambulatory Visit
Admission: RE | Admit: 2022-09-24 | Discharge: 2022-09-24 | Disposition: A | Discharge status: Home / Self Care | Payer: 59 | Source: Ambulatory Visit | Attending: Physician Assistant | Admitting: Physician Assistant

## 2022-09-24 ENCOUNTER — Ambulatory Visit (INDEPENDENT_AMBULATORY_CARE_PROVIDER_SITE_OTHER): Payer: 59 | Admitting: Adult Health

## 2022-09-24 ENCOUNTER — Other Ambulatory Visit (HOSPITAL_COMMUNITY): Payer: Self-pay

## 2022-09-24 ENCOUNTER — Ambulatory Visit: Payer: 59

## 2022-09-24 ENCOUNTER — Encounter (INDEPENDENT_AMBULATORY_CARE_PROVIDER_SITE_OTHER): Payer: Self-pay | Admitting: Adult Health

## 2022-09-24 VITALS — BP 113/77 | HR 58 | Temp 98.1°F | Ht 64.0 in | Wt 185.0 lb

## 2022-09-24 DIAGNOSIS — Z1231 Encounter for screening mammogram for malignant neoplasm of breast: Secondary | ICD-10-CM | POA: Diagnosis not present

## 2022-09-24 DIAGNOSIS — E669 Obesity, unspecified: Secondary | ICD-10-CM

## 2022-09-24 DIAGNOSIS — Z6831 Body mass index (BMI) 31.0-31.9, adult: Secondary | ICD-10-CM | POA: Diagnosis not present

## 2022-09-24 DIAGNOSIS — E559 Vitamin D deficiency, unspecified: Secondary | ICD-10-CM

## 2022-09-24 MED ORDER — WEGOVY 2.4 MG/0.75ML ~~LOC~~ SOAJ
2.4000 mg | SUBCUTANEOUS | 0 refills | Status: DC
  Filled 2022-09-24: qty 3, 28d supply, fill #0

## 2022-09-25 ENCOUNTER — Other Ambulatory Visit (HOSPITAL_COMMUNITY): Payer: Self-pay

## 2022-09-26 ENCOUNTER — Other Ambulatory Visit (HOSPITAL_COMMUNITY): Payer: Self-pay

## 2022-10-01 NOTE — Progress Notes (Signed)
Chief Complaint:   OBESITY Shannon Wagner is here to discuss her progress with her obesity treatment plan along with follow-up of her obesity related diagnoses. Shannon Wagner is on practicing portion control and making smarter food choices, such as increasing vegetables and decreasing simple carbohydrates and states she is following her eating plan approximately 75% of the time. Shannon Wagner states she is Pilate's 60 minutes 1 times per week.  Today's visit was #: 68 Starting weight: 236 lbs Starting date: 04/19/2021 Today's weight: 185 lbs Today's date: 09/24/2022 Total lbs lost to date: 51 lbs Total lbs lost since last in-office visit:  +1 lb  Interim History:  Shannon Wagner has been on Wegovy 1.7mg  once weekly injection. She denies mass in neck, dysphagia, dyspepsia, persistent hoarseness, abdominal pain, or N/V/Constipation. When Shannon Wagner attempted to RF last Wegovy 1.7mg - pharmacy informed her that Rx was on backorder. She has been on this [facility] strength for >6 months and she endorses increases breakthrough polyphagia.  [facility] 2024 1) Maintain weight loss- current weight loss to date since starting HWW: 51 lbs. 2) Increase meal planning and prepping each week.  Subjective:   1. Vitamin D deficiency 04/22/2022, vitamin D level was 53.5.   Patient's energy levels are stable. She reports taking OTC Multivitamin.  Assessment/Plan:   1. Vitamin D deficiency Check labs at next office visit.  2. Obesity with current BMI of 31.8 Refill and Increase Wegovy 2.4mg  once weekly injection, Disp 65ml RF 0  Shannon Wagner is currently in the action stage of change. As such, her goal is to continue with weight loss efforts. She has agreed to practicing portion control and making smarter food choices, such as increasing vegetables and decreasing simple carbohydrates.   Exercise goals:  As is.   Behavioral modification strategies: increasing lean protein intake, decreasing simple carbohydrates, meal  planning and cooking strategies, keeping healthy foods in the home, and planning for success.  Mayo has agreed to follow-up with our clinic in 4 weeks. She was informed of the importance of frequent follow-up visits to maximize her success with intensive lifestyle modifications for her multiple health conditions.   Objective:   Blood pressure 113/77, pulse (!) 58, temperature 98.1 F (36.7 C), height 5\' 4"  (1.626 m), weight 185 lb (83.9 kg), SpO2 100 %. Body mass index is 31.76 kg/m.  General: Cooperative, alert, well developed, in no acute distress. HEENT: Conjunctivae and lids unremarkable. Cardiovascular: Regular rhythm.  Lungs: Normal work of breathing. Neurologic: No focal deficits.   Lab Results  Component Value Date   CREATININE 0.70 04/22/2022   BUN 10 04/22/2022   NA 141 04/22/2022   K 5.3 (H) 04/22/2022   CL 103 04/22/2022   CO2 16 (L) 04/22/2022   Lab Results  Component Value Date   ALT 9 04/22/2022   AST 10 04/22/2022   ALKPHOS 58 04/22/2022   BILITOT 0.6 04/22/2022   Lab Results  Component Value Date   HGBA1C 4.9 04/22/2022   HGBA1C 4.9 12/13/2021   HGBA1C 5.1 04/19/2021   Lab Results  Component Value Date   INSULIN 9.5 04/22/2022   INSULIN 2.3 (L) 12/13/2021   INSULIN 10.2 04/19/2021   Lab Results  Component Value Date   TSH 1.320 12/13/2021   Lab Results  Component Value Date   CHOL 170 12/13/2021   HDL 63 12/13/2021   LDLCALC 94 12/13/2021   TRIG 66 12/13/2021   CHOLHDL 2.7 12/13/2021   Lab Results  Component Value Date   VD25OH  53.5 04/22/2022   VD25OH 92.4 12/13/2021   VD25OH 53.6 04/19/2021   Lab Results  Component Value Date   WBC 8.6 01/29/2021   HGB 14.6 01/29/2021   HCT 43.2 01/29/2021   MCV 93.2 01/29/2021   PLT 329.0 01/29/2021   No results found for: "IRON", "TIBC", "FERRITIN"  Attestation Statements:   Reviewed by clinician on day of visit: allergies, medications, problem list, medical history, surgical history,  family history, social history, and previous encounter notes.  I, Davy Pique, RMA, am acting as Location manager for Mina Marble, NP.  I have reviewed the above documentation for accuracy and completeness, and I agree with the above. -  Cayman Kielbasa d. Azra Abrell, NP-C

## 2022-10-07 DIAGNOSIS — M25572 Pain in left ankle and joints of left foot: Secondary | ICD-10-CM | POA: Diagnosis not present

## 2022-10-07 DIAGNOSIS — M25372 Other instability, left ankle: Secondary | ICD-10-CM | POA: Diagnosis not present

## 2022-10-07 DIAGNOSIS — M25571 Pain in right ankle and joints of right foot: Secondary | ICD-10-CM | POA: Diagnosis not present

## 2022-10-07 DIAGNOSIS — M25371 Other instability, right ankle: Secondary | ICD-10-CM | POA: Diagnosis not present

## 2022-10-21 DIAGNOSIS — M25571 Pain in right ankle and joints of right foot: Secondary | ICD-10-CM | POA: Diagnosis not present

## 2022-10-22 ENCOUNTER — Other Ambulatory Visit (HOSPITAL_COMMUNITY): Payer: Self-pay

## 2022-10-22 ENCOUNTER — Ambulatory Visit (INDEPENDENT_AMBULATORY_CARE_PROVIDER_SITE_OTHER): Payer: 59 | Admitting: Adult Health

## 2022-10-22 ENCOUNTER — Encounter (INDEPENDENT_AMBULATORY_CARE_PROVIDER_SITE_OTHER): Payer: Self-pay | Admitting: Adult Health

## 2022-10-22 VITALS — BP 106/75 | HR 64 | Temp 98.0°F | Ht 64.0 in | Wt 184.0 lb

## 2022-10-22 DIAGNOSIS — E038 Other specified hypothyroidism: Secondary | ICD-10-CM

## 2022-10-22 DIAGNOSIS — E669 Obesity, unspecified: Secondary | ICD-10-CM

## 2022-10-22 DIAGNOSIS — E559 Vitamin D deficiency, unspecified: Secondary | ICD-10-CM

## 2022-10-22 DIAGNOSIS — Z6831 Body mass index (BMI) 31.0-31.9, adult: Secondary | ICD-10-CM | POA: Diagnosis not present

## 2022-10-22 DIAGNOSIS — F411 Generalized anxiety disorder: Secondary | ICD-10-CM | POA: Diagnosis not present

## 2022-10-22 DIAGNOSIS — E88819 Insulin resistance, unspecified: Secondary | ICD-10-CM

## 2022-10-22 MED ORDER — WEGOVY 2.4 MG/0.75ML ~~LOC~~ SOAJ
2.4000 mg | SUBCUTANEOUS | 0 refills | Status: DC
  Filled 2022-10-22: qty 3, 28d supply, fill #0

## 2022-10-22 MED ORDER — WEGOVY 2.4 MG/0.75ML ~~LOC~~ SOAJ
2.4000 mg | SUBCUTANEOUS | 2 refills | Status: DC
  Filled 2022-10-22: qty 3, 28d supply, fill #0
  Filled 2022-11-17: qty 3, 28d supply, fill #1
  Filled 2022-12-12: qty 3, 28d supply, fill #2

## 2022-10-22 MED ORDER — FLUOXETINE HCL 40 MG PO CAPS
40.0000 mg | ORAL_CAPSULE | Freq: Every day | ORAL | 0 refills | Status: DC
  Filled 2022-10-22: qty 90, 90d supply, fill #0

## 2022-10-22 NOTE — Progress Notes (Signed)
Chief Complaint:   OBESITY Shannon Wagner is here to discuss her progress with her obesity treatment plan along with follow-up of her obesity related diagnoses. Shannon Wagner is on practicing portion control and making smarter food choices, such as increasing vegetables and decreasing simple carbohydrates and states she is following her eating plan approximately 75% of the time. Shannon Wagner states she is pilates 60 minutes 1 times per week.  Today's visit was #: 13 Starting weight: 236 lbs Starting date: 04/19/2021 Today's weight: 184 lbs Today's date: 10/22/2022 Total lbs lost to date: 52 lbs Total lbs lost since last in-office visit: - 1lbs   Interim History:  Wegovy therapy increased from 1.38m to 2.448mat last OV. She has had 2 doses at the increased strength- denies any SE. She reports stable appetite.  She would like to loss another 10bs, current weight 184 lbs with corresponding BMI 31.7- she has lost 52 lbs!  Subjective:   1. Other specified hypothyroidism She is currently on Levothyroxine 17539mQD. She denies heat/cold intolerances, palpitations, or increase in anxiety sx's.  2. Insulin resistance Insulin level has fluctuated between 2.3-10.2  3. Vitamin D deficiency She endorses stable energy levels. She is on OTC Multivitamin.  4. Generalized anxiety disorder She reports stable mood, denies SI/HI. Currently on daily Fluoxetine 72m52mAssessment/Plan:   1. Other specified hypothyroidism Check labs  2. Insulin resistance Check labs  3. Vitamin D deficiency Check labs  4. Generalized anxiety disorder Refill Fluoxetine 72mg104mDisp 90 RF 0  5. Obesity with current BMI of 31.7 Refill Wegovy 2.4mg o81m weekly injection RF 2  Shannon Wagner in the action stage of change. As such, her goal is to continue with weight loss efforts. She has agreed to practicing portion control and making smarter food choices, such as increasing vegetables and decreasing simple  carbohydrates.   Exercise goals: For substantial health benefits, adults should do at least 150 minutes (2 hours and 30 minutes) a week of moderate-intensity, or 75 minutes (1 hour and 15 minutes) a week of vigorous-intensity aerobic physical activity, or an equivalent combination of moderate- and vigorous-intensity aerobic activity. Aerobic activity should be performed in episodes of at least 10 minutes, and preferably, it should be spread throughout the week.  Behavioral modification strategies: increasing lean protein intake, decreasing simple carbohydrates, increasing vegetables, increasing water intake, decreasing sodium intake, no skipping meals, meal planning and cooking strategies, and planning for success.  JennifJosephinegreed to follow-up with our clinic in 8 weeks. She was informed of the importance of frequent follow-up visits to maximize her success with intensive lifestyle modifications for her multiple health conditions.   Shannon Wagner we would discuss her lab results at her next visit unless there is a critical issue that needs to be addressed sooner. Shannon Wagner to keep her next visit at the agreed upon time to discuss these results.  Objective:   Blood pressure 106/75, pulse 64, temperature 98 F (36.7 C), height 5' 4"$  (1.626 m), weight 184 lb (83.5 kg), SpO2 100 %. Body mass index is 31.58 kg/m.  General: Cooperative, alert, well developed, in no acute distress. HEENT: Conjunctivae and lids unremarkable. Cardiovascular: Regular rhythm.  Lungs: Normal work of breathing. Neurologic: No focal deficits.   Lab Results  Component Value Date   CREATININE 0.70 04/22/2022   BUN 10 04/22/2022   NA 141 04/22/2022   K 5.3 (H) 04/22/2022   CL 103 04/22/2022   CO2 16 (L) 04/22/2022  Lab Results  Component Value Date   ALT 9 04/22/2022   AST 10 04/22/2022   ALKPHOS 58 04/22/2022   BILITOT 0.6 04/22/2022   Lab Results  Component Value Date   HGBA1C 4.9  04/22/2022   HGBA1C 4.9 12/13/2021   HGBA1C 5.1 04/19/2021   Lab Results  Component Value Date   INSULIN 9.5 04/22/2022   INSULIN 2.3 (L) 12/13/2021   INSULIN 10.2 04/19/2021   Lab Results  Component Value Date   TSH 1.320 12/13/2021   Lab Results  Component Value Date   CHOL 170 12/13/2021   HDL 63 12/13/2021   LDLCALC 94 12/13/2021   TRIG 66 12/13/2021   CHOLHDL 2.7 12/13/2021   Lab Results  Component Value Date   VD25OH 53.5 04/22/2022   VD25OH 92.4 12/13/2021   VD25OH 53.6 04/19/2021   Lab Results  Component Value Date   WBC 8.6 01/29/2021   HGB 14.6 01/29/2021   HCT 43.2 01/29/2021   MCV 93.2 01/29/2021   PLT 329.0 01/29/2021   No results found for: "IRON", "TIBC", "FERRITIN"   Attestation Statements:   Reviewed by clinician on day of visit: allergies, medications, problem list, medical history, surgical history, family history, social history, and previous encounter notes.  I have reviewed the above documentation for accuracy and completeness, and I agree with the above. -  Glorine Hanratty d. Bisma Klett, NP-C

## 2022-10-23 ENCOUNTER — Other Ambulatory Visit (INDEPENDENT_AMBULATORY_CARE_PROVIDER_SITE_OTHER): Payer: Self-pay | Admitting: Adult Health

## 2022-10-23 ENCOUNTER — Encounter (INDEPENDENT_AMBULATORY_CARE_PROVIDER_SITE_OTHER): Payer: Self-pay | Admitting: Adult Health

## 2022-10-23 ENCOUNTER — Other Ambulatory Visit (HOSPITAL_COMMUNITY): Payer: Self-pay

## 2022-10-23 DIAGNOSIS — E038 Other specified hypothyroidism: Secondary | ICD-10-CM

## 2022-10-23 LAB — COMPREHENSIVE METABOLIC PANEL
ALT: 9 IU/L (ref 0–32)
AST: 13 IU/L (ref 0–40)
Albumin/Globulin Ratio: 2.1 (ref 1.2–2.2)
Albumin: 4.5 g/dL (ref 3.9–4.9)
Alkaline Phosphatase: 57 IU/L (ref 44–121)
BUN/Creatinine Ratio: 18 (ref 9–23)
BUN: 12 mg/dL (ref 6–24)
Bilirubin Total: 0.7 mg/dL (ref 0.0–1.2)
CO2: 21 mmol/L (ref 20–29)
Calcium: 9.3 mg/dL (ref 8.7–10.2)
Chloride: 102 mmol/L (ref 96–106)
Creatinine, Ser: 0.67 mg/dL (ref 0.57–1.00)
Globulin, Total: 2.1 g/dL (ref 1.5–4.5)
Glucose: 71 mg/dL (ref 70–99)
Potassium: 4.9 mmol/L (ref 3.5–5.2)
Sodium: 137 mmol/L (ref 134–144)
Total Protein: 6.6 g/dL (ref 6.0–8.5)
eGFR: 110 mL/min/{1.73_m2} (ref >59)

## 2022-10-23 LAB — INSULIN, RANDOM: INSULIN: 5 u[IU]/mL (ref 2.6–24.9)

## 2022-10-23 LAB — TSH+FREE T4
Free T4: 2.09 ng/dL — ABNORMAL HIGH (ref 0.82–1.77)
TSH: 0.316 u[IU]/mL — ABNORMAL LOW (ref 0.450–4.500)

## 2022-10-23 LAB — VITAMIN D 25 HYDROXY (VIT D DEFICIENCY, FRACTURES): Vit D, 25-Hydroxy: 47.2 ng/mL (ref 30.0–100.0)

## 2022-10-23 LAB — HEMOGLOBIN A1C
Est. average glucose Bld gHb Est-mCnc: 97 mg/dL
Hgb A1c MFr Bld: 5 % (ref 4.8–5.6)

## 2022-10-23 LAB — VITAMIN B12: Vitamin B-12: 527 pg/mL (ref 232–1245)

## 2022-10-23 MED ORDER — LEVOTHYROXINE SODIUM 150 MCG PO TABS
150.0000 ug | ORAL_TABLET | Freq: Every day | ORAL | 0 refills | Status: DC
  Filled 2022-10-23: qty 90, 90d supply, fill #0

## 2022-10-24 ENCOUNTER — Other Ambulatory Visit (HOSPITAL_COMMUNITY): Payer: Self-pay

## 2022-11-01 DIAGNOSIS — M216X1 Other acquired deformities of right foot: Secondary | ICD-10-CM | POA: Diagnosis not present

## 2022-11-01 DIAGNOSIS — M25371 Other instability, right ankle: Secondary | ICD-10-CM | POA: Diagnosis not present

## 2022-11-04 ENCOUNTER — Ambulatory Visit (INDEPENDENT_AMBULATORY_CARE_PROVIDER_SITE_OTHER): Payer: Self-pay | Admitting: Adult Health

## 2022-11-28 DIAGNOSIS — M25371 Other instability, right ankle: Secondary | ICD-10-CM | POA: Diagnosis not present

## 2022-12-16 ENCOUNTER — Ambulatory Visit (INDEPENDENT_AMBULATORY_CARE_PROVIDER_SITE_OTHER): Payer: 59 | Admitting: Adult Health

## 2022-12-16 ENCOUNTER — Other Ambulatory Visit (HOSPITAL_COMMUNITY): Payer: Self-pay

## 2022-12-16 ENCOUNTER — Encounter (INDEPENDENT_AMBULATORY_CARE_PROVIDER_SITE_OTHER): Payer: Self-pay | Admitting: Adult Health

## 2022-12-16 DIAGNOSIS — E038 Other specified hypothyroidism: Secondary | ICD-10-CM

## 2022-12-16 DIAGNOSIS — F411 Generalized anxiety disorder: Secondary | ICD-10-CM | POA: Diagnosis not present

## 2022-12-16 DIAGNOSIS — E88819 Insulin resistance, unspecified: Secondary | ICD-10-CM

## 2022-12-16 DIAGNOSIS — Z6841 Body Mass Index (BMI) 40.0 and over, adult: Secondary | ICD-10-CM

## 2022-12-16 MED ORDER — FLUOXETINE HCL 40 MG PO CAPS
40.0000 mg | ORAL_CAPSULE | Freq: Every day | ORAL | 0 refills | Status: DC
Start: 2022-12-16 — End: 2023-02-10
  Filled 2022-12-16: qty 90, 90d supply, fill #0

## 2022-12-16 MED ORDER — BUPROPION HCL ER (XL) 150 MG PO TB24
150.0000 mg | ORAL_TABLET | Freq: Every day | ORAL | 0 refills | Status: DC
  Filled 2022-12-16: qty 90, 90d supply, fill #0

## 2022-12-16 NOTE — Progress Notes (Signed)
WEIGHT SUMMARY AND BIOMETRICS  Vitals Temp: 97.9 F (36.6 C) BP: 114/75 Pulse Rate: 65 SpO2: 99 %   Anthropometric Measurements Height: 5\' 4"  (1.626 m) Weight: 186 lb (84.4 kg) BMI (Calculated): 31.91 Weight at Last Visit: 184lb Weight Lost Since Last Visit: 0 Weight Gained Since Last Visit: 2lb Starting Weight: 236lb Total Weight Loss (lbs): 50 lb (22.7 kg)   Body Composition  Body Fat %: 41.1 % Fat Mass (lbs): 76.6 lbs Muscle Mass (lbs): 104.2 lbs Total Body Water (lbs): 77 lbs Visceral Fat Rating : 9   Other Clinical Data Fasting: No Labs: No Today's Visit #: 20 Starting Date: 04/19/21    Chief Complaint:   OBESITY Shannon Wagner is here to discuss her progress with her obesity treatment plan. She is on the practicing portion control and making smarter food choices, such as increasing vegetables and decreasing simple carbohydrates and states she is following her eating plan approximately 75 % of the time. She states she is exercising Pilates 60 minutes 1 times per week.   Interim History:  Shannon Wagner continues to eat healthy and remain active. She is very frustrated that her insurance will not cover GLP-1 therapy for Obesity treatment after April 2024. She picked up her final RF of Wegovy 2.4mg , which will provide her 7 doses of injectable therapy.  Shannon Wagner reports increased depressed mood, increased fatigue, and lack of motivation to increase exercise since on/about Dec 2023. Her husband is experiencing exacerbation of an autoimmune disorder. She denies SI/HI.  Subjective:   1. Other specified hypothyroidism Discussed Labs   Latest Reference Range & Units 10/22/22 09:40  TSH 0.450 - 4.500 uIU/mL 0.316 (L)  T4,Free(Direct) 0.82 - 1.77 ng/dL 8.10 (H)   Your TSH resulted suppressed: 0.316 Your T4, Free was elevated: 2.09 T4, Free was also elevated at last check: 2.08 on 12/13/21   Upon records check you were previously on Levothyrozine , increased  to 175 mcg after TSH of 32.50 on 09/28/2019   Hypothyroidism Dx'd on/about 2008  Levothyroxine decreased frp, to on 10/23/2022  2. Insulin resistance Discussed Labs  Latest Reference Range & Units 10/22/22 09:40  Glucose 70 - 99 mg/dL 71  Hemoglobin F7P 4.8 - 5.6 % 5.0  Est. average glucose Bld gHb Est-mCnc mg/dL 97  INSULIN 2.6 - 10.2 uIU/mL 5.0  She is on weekly WEgovy 2.4mg  Denies mass in neck, dysphagia, dyspepsia, persistent hoarseness, abdominal pain, or N/V/C   3. Generalized anxiety disorder [name] reports increased depressed mood, increased fatigue, and lack of motivation to increase exercise since on/about Dec 2023. Her husband is experiencing exacerbation of an autoimmune disorder. She denies SI/HI. She is currently on Fluoxetine 40mg  QD. She was previously on Contrave- tolerated well. She denies hx of seizure d/o or nephrolithiasis. BP excellent at OV.  Assessment/Plan:   1. Other specified hypothyroidism Check Labs and send MyChart message with results/RF Levothyroxine as appropriate - TSH + free T4  2. Insulin resistance Increase protein, reduce CHO/sugar intake  3. Generalized anxiety disorder Refill - FLUoxetine (PROZAC) 40 MG capsule; Take 1 capsule (40 mg total) by mouth daily.  Dispense: 90 capsule; Refill: 0 Start Wellbutrin XL 150mg  QD Disp 90 RF 0  4. Morbid obesity (HCC), starting BMI 40.51  Shannon Wagner is currently in the action stage of change. As such, her goal is to continue with weight loss efforts. She has agreed to practicing portion control and making smarter food choices, such as increasing vegetables and  decreasing simple carbohydrates.   Exercise goals: For substantial health benefits, adults should do at least 150 minutes (2 hours and 30 minutes) a week of moderate-intensity, or 75 minutes (1 hour and 15 minutes) a week of vigorous-intensity aerobic physical activity, or an equivalent combination of moderate- and  vigorous-intensity aerobic activity. Aerobic activity should be performed in episodes of at least 10 minutes, and preferably, it should be spread throughout the week.  Behavioral modification strategies: increasing lean protein intake, decreasing simple carbohydrates, increasing vegetables, increasing water intake, no skipping meals, better snacking choices, and planning for success.  Shannon Wagner has agreed to follow-up with our clinic in 8 weeks. She was informed of the importance of frequent follow-up visits to maximize her success with intensive lifestyle modifications for her multiple health conditions.   Shannon Wagner was informed we would discuss her lab results at her next visit unless there is a critical issue that needs to be addressed sooner. [name] agreed to keep her next visit at the agreed upon time to discuss these results.  Objective:   Blood pressure 114/75, pulse 65, temperature 97.9 F (36.6 C), height 5\' 4"  (1.626 m), weight 186 lb (84.4 kg), SpO2 99 %. Body mass index is 31.93 kg/m.  General: Cooperative, alert, well developed, in no acute distress. HEENT: Conjunctivae and lids unremarkable. Cardiovascular: Regular rhythm.  Lungs: Normal work of breathing. Neurologic: No focal deficits.   Lab Results  Component Value Date   CREATININE 0.67 10/22/2022   BUN 12 10/22/2022   NA 137 10/22/2022   K 4.9 10/22/2022   CL 102 10/22/2022   CO2 21 10/22/2022   Lab Results  Component Value Date   ALT 9 10/22/2022   AST 13 10/22/2022   ALKPHOS 57 10/22/2022   BILITOT 0.7 10/22/2022   Lab Results  Component Value Date   HGBA1C 5.0 10/22/2022   HGBA1C 4.9 04/22/2022   HGBA1C 4.9 12/13/2021   HGBA1C 5.1 04/19/2021   Lab Results  Component Value Date   INSULIN 5.0 10/22/2022   INSULIN 9.5 04/22/2022   INSULIN 2.3 (L) 12/13/2021   INSULIN 10.2 04/19/2021   Lab Results  Component Value Date   TSH 0.316 (L) 10/22/2022   Lab Results  Component Value Date   CHOL 170  12/13/2021   HDL 63 12/13/2021   LDLCALC 94 12/13/2021   TRIG 66 12/13/2021   CHOLHDL 2.7 12/13/2021   Lab Results  Component Value Date   VD25OH 47.2 10/22/2022   VD25OH 53.5 04/22/2022   VD25OH 92.4 12/13/2021   Lab Results  Component Value Date   WBC 8.6 01/29/2021   HGB 14.6 01/29/2021   HCT 43.2 01/29/2021   MCV 93.2 01/29/2021   PLT 329.0 01/29/2021   No results found for: "IRON", "TIBC", "FERRITIN"  Attestation Statements:   Reviewed by clinician on day of visit: allergies, medications, problem list, medical history, surgical history, family history, social history, and previous encounter notes.  I have reviewed the above documentation for accuracy and completeness, and I agree with the above. -  Kristianna Saperstein d. Melvyn Hommes, NP-C

## 2022-12-17 ENCOUNTER — Other Ambulatory Visit (HOSPITAL_COMMUNITY): Payer: Self-pay

## 2022-12-17 ENCOUNTER — Encounter (INDEPENDENT_AMBULATORY_CARE_PROVIDER_SITE_OTHER): Payer: Self-pay | Admitting: Adult Health

## 2022-12-17 ENCOUNTER — Other Ambulatory Visit (INDEPENDENT_AMBULATORY_CARE_PROVIDER_SITE_OTHER): Payer: Self-pay | Admitting: Adult Health

## 2022-12-17 DIAGNOSIS — E038 Other specified hypothyroidism: Secondary | ICD-10-CM

## 2022-12-17 LAB — TSH+FREE T4
Free T4: 1.69 ng/dL (ref 0.82–1.77)
TSH: 0.968 u[IU]/mL (ref 0.450–4.500)

## 2022-12-17 MED ORDER — LEVOTHYROXINE SODIUM 150 MCG PO TABS
150.0000 ug | ORAL_TABLET | Freq: Every day | ORAL | 0 refills | Status: DC
Start: 2022-12-17 — End: 2023-02-10
  Filled 2022-12-17 – 2023-01-24 (×2): qty 90, 90d supply, fill #0

## 2023-01-24 ENCOUNTER — Other Ambulatory Visit (HOSPITAL_COMMUNITY): Payer: Self-pay

## 2023-02-10 ENCOUNTER — Ambulatory Visit (INDEPENDENT_AMBULATORY_CARE_PROVIDER_SITE_OTHER): Payer: 59 | Admitting: Adult Health

## 2023-02-10 ENCOUNTER — Encounter (INDEPENDENT_AMBULATORY_CARE_PROVIDER_SITE_OTHER): Payer: Self-pay | Admitting: Adult Health

## 2023-02-10 ENCOUNTER — Other Ambulatory Visit (HOSPITAL_COMMUNITY): Payer: Self-pay

## 2023-02-10 VITALS — BP 103/70 | HR 65 | Temp 98.0°F | Ht 64.0 in | Wt 191.0 lb

## 2023-02-10 DIAGNOSIS — E669 Obesity, unspecified: Secondary | ICD-10-CM | POA: Diagnosis not present

## 2023-02-10 DIAGNOSIS — E88819 Insulin resistance, unspecified: Secondary | ICD-10-CM

## 2023-02-10 DIAGNOSIS — E038 Other specified hypothyroidism: Secondary | ICD-10-CM

## 2023-02-10 DIAGNOSIS — Z6832 Body mass index (BMI) 32.0-32.9, adult: Secondary | ICD-10-CM

## 2023-02-10 DIAGNOSIS — F411 Generalized anxiety disorder: Secondary | ICD-10-CM

## 2023-02-10 MED ORDER — FLUOXETINE HCL 40 MG PO CAPS
40.0000 mg | ORAL_CAPSULE | Freq: Every day | ORAL | 0 refills | Status: DC
Start: 2023-02-10 — End: 2023-04-08
  Filled 2023-02-10: qty 90, 90d supply, fill #0

## 2023-02-10 MED ORDER — BUPROPION HCL ER (XL) 150 MG PO TB24
150.0000 mg | ORAL_TABLET | Freq: Every day | ORAL | 0 refills | Status: DC
  Filled 2023-02-10 – 2023-03-14 (×3): qty 90, 90d supply, fill #0

## 2023-02-10 MED ORDER — LEVOTHYROXINE SODIUM 150 MCG PO TABS
150.0000 ug | ORAL_TABLET | Freq: Every day | ORAL | 0 refills | Status: DC
Start: 2023-02-10 — End: 2023-04-08
  Filled 2023-02-10: qty 90, 90d supply, fill #0

## 2023-02-10 NOTE — Progress Notes (Signed)
WEIGHT SUMMARY AND BIOMETRICS  Vitals Temp: 98 F (36.7 C) BP: 103/70 Pulse Rate: 65 SpO2: 100 %   Anthropometric Measurements Height: 5\' 4"  (1.626 m) Weight: 191 lb (86.6 kg) BMI (Calculated): 32.77 Weight at Last Visit: 186lb Weight Lost Since Last Visit: 0 Weight Gained Since Last Visit: 5lb Starting Weight: 236lb Total Weight Loss (lbs): 45 lb (20.4 kg)   Body Composition  Body Fat %: 39.6 % Fat Mass (lbs): 75.6 lbs Muscle Mass (lbs): 109.6 lbs Total Body Water (lbs): 78.4 lbs Visceral Fat Rating : 9   Other Clinical Data Fasting: no Labs: no Today's Visit #: 21 Starting Date: 04/19/21    Chief Complaint:   OBESITY Shannon Wagner is here to discuss her progress with her obesity treatment plan. She is on the practicing portion control and making smarter food choices, such as increasing vegetables and decreasing simple carbohydrates and states she is following her eating plan approximately 75 % of the time. She states she is exercising Pilates/Walking 60/30 minutes 1/2 times per week.   Interim History:  She has spaced out Wegovy 2.4 injections to every 14 days Denies mass in neck, dysphagia, dyspepsia, persistent hoarseness, abdominal pain, or N/V/C  She has 4 more doses  Reviewed Bioimpedance Results with pt: Muscle Mass: +5.4 lbs Adipose Mass: - 1 lbs  Of note- She reports recent LLQ intermittent pain. She denies hematuria or hematochezia She denies change in bowel/bladder habits. She does not have an established GYN- local provider list given to pt. Will obtain labs at next OV  Subjective:   1. Insulin resistance Discussed Labs  Latest Reference Range & Units 10/22/22 09:40  Glucose 70 - 99 mg/dL 71  Hemoglobin Z6X 4.8 - 5.6 % 5.0  Est. average glucose Bld gHb Est-mCnc mg/dL 97  INSULIN 2.6 - 09.6 uIU/mL 5.0  She has spaced out Wegovy 2.4 injections to every 14 days Denies mass in neck, dysphagia, dyspepsia, persistent hoarseness, or N/V/C   She has 4 more doses  2. Other specified hypothyroidism Discussed Labs  Latest Reference Range & Units 12/16/22 10:55  TSH 0.450 - 4.500 uIU/mL 0.968  T4,Free(Direct) 0.82 - 1.77 ng/dL 0.45  Continued on daily Levothyroxine  3. Generalized anxiety disorder 12/16/2022- Continued on daily Prozac and started on Wellbutrin XL 150mg  She reports great decrease in anxiety levels. She denies SI/HI   Assessment/Plan:   1. Insulin resistance Continue regular exercise  2. Other specified hypothyroidism Refill - levothyroxine (SYNTHROID) 150 MCG tablet; Take 1 tablet (150 mcg total) by mouth daily before breakfast.  Dispense: 90 tablet; Refill: 0  3. Generalized anxiety disorder Refill - FLUoxetine (PROZAC) 40 MG capsule; Take 1 capsule (40 mg total) by mouth daily.  Dispense: 90 capsule; Refill: 0  4. Obesity with current BMI of 32.77  Shannon Wagner is currently in the action stage of change. As such, her goal is to continue with weight loss efforts. She has agreed to practicing portion control and making smarter food choices, such as increasing vegetables and decreasing simple carbohydrates.   Exercise goals: For substantial health benefits, adults should do at least 150 minutes (2 hours and 30 minutes) a week of moderate-intensity, or 75 minutes (1 hour and 15 minutes) a week of vigorous-intensity aerobic physical activity, or an equivalent combination of moderate- and vigorous-intensity aerobic activity. Aerobic activity should be performed in episodes of at least 10 minutes, and preferably, it should be spread throughout the week.  Behavioral modification strategies: increasing lean protein intake,  decreasing simple carbohydrates, increasing vegetables, increasing water intake, no skipping meals, meal planning and cooking strategies, and planning for success.  [name] has agreed to follow-up with our clinic in 4 weeks. She was informed of the importance of frequent follow-up visits to  maximize her success with intensive lifestyle modifications for her multiple health conditions.   Check Fasting Labs at next OV  Objective:   Blood pressure 103/70, pulse 65, temperature 98 F (36.7 C), height 5\' 4"  (1.626 m), weight 191 lb (86.6 kg), SpO2 100 %. Body mass index is 32.79 kg/m.  General: Cooperative, alert, well developed, in no acute distress. HEENT: Conjunctivae and lids unremarkable. Cardiovascular: Regular rhythm.  Lungs: Normal work of breathing. Neurologic: No focal deficits.   Lab Results  Component Value Date   CREATININE 0.67 10/22/2022   BUN 12 10/22/2022   NA 137 10/22/2022   K 4.9 10/22/2022   CL 102 10/22/2022   CO2 21 10/22/2022   Lab Results  Component Value Date   ALT 9 10/22/2022   AST 13 10/22/2022   ALKPHOS 57 10/22/2022   BILITOT 0.7 10/22/2022   Lab Results  Component Value Date   HGBA1C 5.0 10/22/2022   HGBA1C 4.9 04/22/2022   HGBA1C 4.9 12/13/2021   HGBA1C 5.1 04/19/2021   Lab Results  Component Value Date   INSULIN 5.0 10/22/2022   INSULIN 9.5 04/22/2022   INSULIN 2.3 (L) 12/13/2021   INSULIN 10.2 04/19/2021   Lab Results  Component Value Date   TSH 0.968 12/16/2022   Lab Results  Component Value Date   CHOL 170 12/13/2021   HDL 63 12/13/2021   LDLCALC 94 12/13/2021   TRIG 66 12/13/2021   CHOLHDL 2.7 12/13/2021   Lab Results  Component Value Date   VD25OH 47.2 10/22/2022   VD25OH 53.5 04/22/2022   VD25OH 92.4 12/13/2021   Lab Results  Component Value Date   WBC 8.6 01/29/2021   HGB 14.6 01/29/2021   HCT 43.2 01/29/2021   MCV 93.2 01/29/2021   PLT 329.0 01/29/2021   No results found for: "IRON", "TIBC", "FERRITIN"  Attestation Statements:   Reviewed by clinician on day of visit: allergies, medications, problem list, medical history, surgical history, family history, social history, and previous encounter notes.  I have reviewed the above documentation for accuracy and completeness, and I agree with  the above. -  Jonavon Trieu d. Parthenia Tellefsen, NP-C

## 2023-03-12 ENCOUNTER — Other Ambulatory Visit (HOSPITAL_COMMUNITY): Payer: Self-pay

## 2023-03-14 ENCOUNTER — Other Ambulatory Visit (HOSPITAL_COMMUNITY): Payer: Self-pay

## 2023-03-19 ENCOUNTER — Ambulatory Visit (INDEPENDENT_AMBULATORY_CARE_PROVIDER_SITE_OTHER): Payer: 59 | Admitting: Adult Health

## 2023-04-04 ENCOUNTER — Encounter: Payer: 59 | Admitting: Emergency Medicine

## 2023-04-04 NOTE — Progress Notes (Signed)
Visit is for patient's son.  Encouraged to create a myChart account for son so that visit can be completed.  Disregard this encounter. This encounter was created in error - please disregard.

## 2023-04-08 ENCOUNTER — Encounter (INDEPENDENT_AMBULATORY_CARE_PROVIDER_SITE_OTHER): Payer: Self-pay | Admitting: Adult Health

## 2023-04-08 ENCOUNTER — Other Ambulatory Visit (HOSPITAL_COMMUNITY): Payer: Self-pay

## 2023-04-08 ENCOUNTER — Ambulatory Visit (INDEPENDENT_AMBULATORY_CARE_PROVIDER_SITE_OTHER): Payer: 59 | Admitting: Adult Health

## 2023-04-08 VITALS — BP 107/74 | HR 72 | Temp 97.9°F | Ht 64.0 in | Wt 196.0 lb

## 2023-04-08 DIAGNOSIS — Z6833 Body mass index (BMI) 33.0-33.9, adult: Secondary | ICD-10-CM

## 2023-04-08 DIAGNOSIS — E559 Vitamin D deficiency, unspecified: Secondary | ICD-10-CM | POA: Diagnosis not present

## 2023-04-08 DIAGNOSIS — E88819 Insulin resistance, unspecified: Secondary | ICD-10-CM

## 2023-04-08 DIAGNOSIS — E038 Other specified hypothyroidism: Secondary | ICD-10-CM | POA: Diagnosis not present

## 2023-04-08 DIAGNOSIS — F411 Generalized anxiety disorder: Secondary | ICD-10-CM

## 2023-04-08 DIAGNOSIS — E669 Obesity, unspecified: Secondary | ICD-10-CM

## 2023-04-08 MED ORDER — FLUOXETINE HCL 40 MG PO CAPS
40.0000 mg | ORAL_CAPSULE | Freq: Every day | ORAL | 0 refills | Status: DC
  Filled 2023-04-08 – 2023-05-13 (×2): qty 90, 90d supply, fill #0

## 2023-04-08 MED ORDER — LEVOTHYROXINE SODIUM 150 MCG PO TABS
150.0000 ug | ORAL_TABLET | Freq: Every day | ORAL | 0 refills | Status: DC
Start: 2023-04-08 — End: 2023-06-03
  Filled 2023-04-08: qty 90, 90d supply, fill #0

## 2023-04-08 MED ORDER — BUPROPION HCL ER (XL) 150 MG PO TB24
150.0000 mg | ORAL_TABLET | Freq: Every day | ORAL | 0 refills | Status: DC
  Filled 2023-04-08: qty 90, 90d supply, fill #0

## 2023-04-08 NOTE — Progress Notes (Signed)
WEIGHT SUMMARY AND BIOMETRICS  Vitals Temp: 97.9 F (36.6 C) BP: 107/74 Pulse Rate: 72 SpO2: 99 %   Anthropometric Measurements Height: 5\' 4"  (1.626 m) Weight: 196 lb (88.9 kg) BMI (Calculated): 33.63 Weight at Last Visit: 191lb Weight Lost Since Last Visit: 0 Weight Gained Since Last Visit: 5lb Starting Weight: 236lb Total Weight Loss (lbs): 40 lb (18.1 kg)   Body Composition  Body Fat %: 39.3 % Fat Mass (lbs): 77 lbs Muscle Mass (lbs): 113 lbs Total Body Water (lbs): 78 lbs Visceral Fat Rating : 9   Other Clinical Data Fasting: yes Labs: no Today's Visit #: 22 Starting Date: 04/19/21    Chief Complaint:   OBESITY Shannon Wagner is here to discuss her progress with her obesity treatment plan. She is on the practicing portion control and making smarter food choices, such as increasing vegetables and decreasing simple carbohydrates and states she is following her eating plan approximately 75 % of the time. She states she is exercising Pilates/Walking 60/30 minutes 1-1/2 times per week.   Interim History:  Shannon Wagner tested + COVID-19 on 04/02/2023 Initial sx's Fever, nasal congestion. She lost her sense of smell 04/07/2023 She denies respiratory distress at present.  She is on Wegovy 2.4mg - injecting every 14 days- last dose on 03/25/2023.   She has one more dose. She is VERY frustrated that her insurance will not further cover AOMs  Subjective:   1. Insulin resistance  Latest Reference Range & Units 04/22/22 09:39 10/22/22 09:40  INSULIN 2.6 - 24.9 uIU/mL 9.5 5.0  She is currently on Wegovy 2.4mg  taking injection every 14 days to preserve here home supply. Last injection 03/25/2023 Denies mass in neck, dysphagia, dyspepsia, persistent hoarseness, abdominal pain, or N/V/C   2. Other specified hypothyroidism  Latest Reference Range & Units 12/16/22 10:55  TSH 0.450 - 4.500 uIU/mL 0.968  T4,Free(Direct) 0.82 - 1.77 ng/dL 1.61  She denies hot/cold intolerance  or palpitations. She is currently on Levothyroxine every am  3. Generalized anxiety disorder She reports stable mood, denies SI/HI She is currently on daily Wellbutrin XL 150mg  and Prozac 40mg   4. Vitamin D deficiency  Latest Reference Range & Units 12/13/21 12:39 04/22/22 09:39 10/22/22 09:40  Vitamin D, 25-Hydroxy 30.0 - 100.0 ng/mL 92.4 53.5 47.2   She reports stable energy levels.  Assessment/Plan:   1. Insulin resistance Check Labs - Comprehensive metabolic panel - Hemoglobin A1c - Insulin, random  2. Other specified hypothyroidism Refill - levothyroxine (SYNTHROID) 150 MCG tablet; Take 1 tablet (150 mcg total) by mouth daily before breakfast.  Dispense: 90 tablet; Refill: 0  3. Generalized anxiety disorder Refill - FLUoxetine (PROZAC) 40 MG capsule; Take 1 capsule (40 mg total) by mouth daily.  Dispense: 90 capsule; Refill: 0   4. Vitamin D deficiency Check Labs - VITAMIN D 25 Hydroxy (Vit-D Deficiency, Fractures)  5. Obesity with current BMI of 33.63  Shannon Wagner is currently in the action stage of change. As such, her goal is to continue with weight loss efforts. She has agreed to practicing portion control and making smarter food choices, such as increasing vegetables and decreasing simple carbohydrates.   Exercise goals: For substantial health benefits, adults should do at least 150 minutes (2 hours and 30 minutes) a week of moderate-intensity, or 75 minutes (1 hour and 15 minutes) a week of vigorous-intensity aerobic physical activity, or an equivalent combination of moderate- and vigorous-intensity aerobic activity. Aerobic activity should be performed in episodes of at least  10 minutes, and preferably, it should be spread throughout the week.  Behavioral modification strategies: increasing lean protein intake, decreasing simple carbohydrates, increasing vegetables, increasing water intake, no skipping meals, meal planning and cooking strategies, and planning  for success.  Shannon Wagner has agreed to follow-up with our clinic in 8 weeks. She was informed of the importance of frequent follow-up visits to maximize her success with intensive lifestyle modifications for her multiple health conditions.   Shannon Wagner was informed we would discuss her lab results at her next visit unless there is a critical issue that needs to be addressed sooner. Shannon Wagner agreed to keep her next visit at the agreed upon time to discuss these results.  Objective:   Blood pressure 107/74, pulse 72, temperature 97.9 F (36.6 C), height 5\' 4"  (1.626 m), weight 196 lb (88.9 kg), SpO2 99%. Body mass index is 33.64 kg/m.  General: Cooperative, alert, well developed, in no acute distress. HEENT: Conjunctivae and lids unremarkable. Cardiovascular: Regular rhythm.  Lungs: Normal work of breathing. Neurologic: No focal deficits.   Lab Results  Component Value Date   CREATININE 0.67 10/22/2022   BUN 12 10/22/2022   NA 137 10/22/2022   K 4.9 10/22/2022   CL 102 10/22/2022   CO2 21 10/22/2022   Lab Results  Component Value Date   ALT 9 10/22/2022   AST 13 10/22/2022   ALKPHOS 57 10/22/2022   BILITOT 0.7 10/22/2022   Lab Results  Component Value Date   HGBA1C 5.0 10/22/2022   HGBA1C 4.9 04/22/2022   HGBA1C 4.9 12/13/2021   HGBA1C 5.1 04/19/2021   Lab Results  Component Value Date   INSULIN 5.0 10/22/2022   INSULIN 9.5 04/22/2022   INSULIN 2.3 (L) 12/13/2021   INSULIN 10.2 04/19/2021   Lab Results  Component Value Date   TSH 0.968 12/16/2022   Lab Results  Component Value Date   CHOL 170 12/13/2021   HDL 63 12/13/2021   LDLCALC 94 12/13/2021   TRIG 66 12/13/2021   CHOLHDL 2.7 12/13/2021   Lab Results  Component Value Date   VD25OH 47.2 10/22/2022   VD25OH 53.5 04/22/2022   VD25OH 92.4 12/13/2021   Lab Results  Component Value Date   WBC 8.6 01/29/2021   HGB 14.6 01/29/2021   HCT 43.2 01/29/2021   MCV 93.2 01/29/2021   PLT 329.0 01/29/2021   No  results found for: "IRON", "TIBC", "FERRITIN"  Attestation Statements:   Reviewed by clinician on day of visit: allergies, medications, problem list, medical history, surgical history, family history, social history, and previous encounter notes.  I have reviewed the above documentation for accuracy and completeness, and I agree with the above. -  Isrrael Fluckiger d. Jewels Langone, NP-C

## 2023-05-11 ENCOUNTER — Telehealth: Payer: 59 | Admitting: Nurse Practitioner

## 2023-05-11 DIAGNOSIS — B9689 Other specified bacterial agents as the cause of diseases classified elsewhere: Secondary | ICD-10-CM | POA: Diagnosis not present

## 2023-05-11 DIAGNOSIS — J019 Acute sinusitis, unspecified: Secondary | ICD-10-CM | POA: Diagnosis not present

## 2023-05-11 MED ORDER — AMOXICILLIN-POT CLAVULANATE 875-125 MG PO TABS
1.0000 | ORAL_TABLET | Freq: Two times a day (BID) | ORAL | 0 refills | Status: AC
Start: 2023-05-11 — End: 2023-05-18

## 2023-05-11 NOTE — Progress Notes (Signed)

## 2023-05-11 NOTE — Progress Notes (Signed)
I have spent 5 minutes in review of e-visit questionnaire, review and updating patient chart, medical decision making and response to patient.  ° °Zelda W Fleming, NP ° °  °

## 2023-05-14 ENCOUNTER — Other Ambulatory Visit (HOSPITAL_COMMUNITY): Payer: Self-pay

## 2023-06-03 ENCOUNTER — Encounter (INDEPENDENT_AMBULATORY_CARE_PROVIDER_SITE_OTHER): Payer: Self-pay | Admitting: Adult Health

## 2023-06-03 ENCOUNTER — Ambulatory Visit (INDEPENDENT_AMBULATORY_CARE_PROVIDER_SITE_OTHER): Payer: 59 | Admitting: Adult Health

## 2023-06-03 ENCOUNTER — Other Ambulatory Visit (HOSPITAL_COMMUNITY): Payer: Self-pay

## 2023-06-03 DIAGNOSIS — Z6835 Body mass index (BMI) 35.0-35.9, adult: Secondary | ICD-10-CM

## 2023-06-03 DIAGNOSIS — E88819 Insulin resistance, unspecified: Secondary | ICD-10-CM

## 2023-06-03 DIAGNOSIS — F411 Generalized anxiety disorder: Secondary | ICD-10-CM

## 2023-06-03 DIAGNOSIS — E038 Other specified hypothyroidism: Secondary | ICD-10-CM | POA: Diagnosis not present

## 2023-06-03 DIAGNOSIS — E559 Vitamin D deficiency, unspecified: Secondary | ICD-10-CM | POA: Diagnosis not present

## 2023-06-03 DIAGNOSIS — E669 Obesity, unspecified: Secondary | ICD-10-CM | POA: Diagnosis not present

## 2023-06-03 MED ORDER — LEVOTHYROXINE SODIUM 150 MCG PO TABS
150.0000 ug | ORAL_TABLET | Freq: Every day | ORAL | 0 refills | Status: DC
  Filled 2023-06-03 – 2023-07-22 (×2): qty 90, 90d supply, fill #0

## 2023-06-03 MED ORDER — BUPROPION HCL ER (XL) 150 MG PO TB24
150.0000 mg | ORAL_TABLET | Freq: Every day | ORAL | 0 refills | Status: DC
  Filled 2023-06-03: qty 90, 90d supply, fill #0

## 2023-06-03 MED ORDER — FLUOXETINE HCL 40 MG PO CAPS
40.0000 mg | ORAL_CAPSULE | Freq: Every day | ORAL | 0 refills | Status: DC
Start: 2023-06-03 — End: 2023-09-08
  Filled 2023-06-03 – 2023-08-09 (×4): qty 90, 90d supply, fill #0

## 2023-06-03 MED ORDER — VITAMIN D (ERGOCALCIFEROL) 1.25 MG (50000 UNIT) PO CAPS
50000.0000 [IU] | ORAL_CAPSULE | ORAL | 0 refills | Status: DC
  Filled 2023-06-03: qty 12, 84d supply, fill #0

## 2023-06-03 MED ORDER — VITAMIN D (ERGOCALCIFEROL) 1.25 MG (50000 UNIT) PO CAPS
50000.0000 [IU] | ORAL_CAPSULE | ORAL | 0 refills | Status: DC
  Filled 2023-06-03: qty 4, 28d supply, fill #0

## 2023-06-03 MED ORDER — METFORMIN HCL 500 MG PO TABS
ORAL_TABLET | ORAL | 0 refills | Status: DC
Start: 2023-06-03 — End: 2023-06-17
  Filled 2023-06-03: qty 90, 90d supply, fill #0

## 2023-06-03 NOTE — Progress Notes (Signed)
WEIGHT SUMMARY AND BIOMETRICS  Vitals Temp: 98.5 F (36.9 C) BP: 115/79 Pulse Rate: 72 SpO2: 99 %   Anthropometric Measurements Height: 5\' 4"  (1.626 m) Weight: 206 lb (93.4 kg) BMI (Calculated): 35.34 Weight at Last Visit: 196 lb Weight Lost Since Last Visit: 0 Weight Gained Since Last Visit: 10 lb Starting Weight: 236 lb Total Weight Loss (lbs): 30 lb (13.6 kg) Peak Weight: 236 lb   Body Composition  Body Fat %: 42.2 % Fat Mass (lbs): 87 lbs Muscle Mass (lbs): 113.2 lbs Total Body Water (lbs): 82 lbs Visceral Fat Rating : 10   Other Clinical Data Labs: no Today's Visit #: 23 Starting Date: 04/19/21    Chief Complaint:   OBESITY Shannon Wagner is here to discuss her progress with her obesity treatment plan. She is on the practicing portion control and making smarter food choices, such as increasing vegetables and decreasing simple carbohydrates and states she is following her eating plan approximately 50 % of the time. She states she is exercising Pilates and Brisk Walking 60 minutes 2 times per week.   Interim History:  Her final injection of Wegovy 2.4mg  was end of August 2024. She endorses increase in appetite.  To preserve supply- she had spaced out Wegovy injection to every 14 days  Current weight 206 lbs, she reports feeling the best when she was in 180 range. Last time she sustained 180s was Fall 2023  Subjective:   1. Insulin resistance Discussed Labs  Latest Reference Range & Units 04/08/23 08:31  Glucose 70 - 99 mg/dL 77  Hemoglobin W0J 4.8 - 5.6 % 4.8  Est. average glucose Bld gHb Est-mCnc mg/dL 91  INSULIN 2.6 - 81.1 uIU/mL 12.3   CBG and A1c both at goal Insulin level slightly above goal Her final injection of Wegovy 2.4mg  was end of August 2024. She endorses increase in appetite.  2. Vitamin D deficiency Discussed Labs  Latest Reference Range & Units 04/08/23 08:31  Vitamin D, 25-Hydroxy 30.0 - 100.0 ng/mL 35.7   Low normal-not  currently on any Vit D Supplementation  3. Generalized anxiety disorder Discussed Labs 04/08/2023 CMP- electrolytes and kindey fx both stable  Her mood is stable on daily Prozac 40mg   4. Other specified hypothyroidism Discussed Labs  Latest Reference Range & Units 12/16/22 10:55  TSH 0.450 - 4.500 uIU/mL 0.968  T4,Free(Direct) 0.82 - 1.77 ng/dL 9.14   Tyroid panel is stable She is on daily Synthroid 150 mcg  Assessment/Plan:   1. Insulin resistance Start  metFORMIN (GLUCOPHAGE) 500 MG tablet Take 0.5 tablets (250 mg total) by mouth daily for 7 days, THEN 1 tablet (500 mg total) daily. Dispense: 90 tablet, Refills: 0 of 0 remaining   2. Vitamin D deficiency Start Vitamin D, Ergocalciferol, (DRISDOL) 1.25 MG (50000 UNIT) CAPS capsule Take 1 capsule (50,000 Units total) by mouth every 7 (seven) days. Dispense: 12 capsule, Refills: 0 of 0 remaining  3. Generalized anxiety disorder Refill - FLUoxetine (PROZAC) 40 MG capsule; Take 1 capsule (40 mg total) by mouth daily.  Dispense: 90 capsule; Refill: 0  4. Other specified hypothyroidism Refill - levothyroxine (SYNTHROID) 150 MCG tablet; Take 1 tablet (150 mcg total) by mouth daily before breakfast.  Dispense: 90 tablet; Refill: 0  5. Obesity with current BMI of 35.4  [name] is currently in the action stage of change. As such, her goal is to continue with weight loss efforts. She has agreed to practicing portion control and making smarter food choices,  such as increasing vegetables and decreasing simple carbohydrates.   Exercise goals: For substantial health benefits, adults should do at least 150 minutes (2 hours and 30 minutes) a week of moderate-intensity, or 75 minutes (1 hour and 15 minutes) a week of vigorous-intensity aerobic physical activity, or an equivalent combination of moderate- and vigorous-intensity aerobic activity. Aerobic activity should be performed in episodes of at least 10 minutes, and preferably, it should  be spread throughout the week.  Behavioral modification strategies: increasing lean protein intake, decreasing simple carbohydrates, increasing vegetables, increasing water intake, meal planning and cooking strategies, keeping healthy foods in the home, ways to avoid night time snacking, better snacking choices, and planning for success.  Shannon Wagner has agreed to follow-up with our clinic in 12 weeks. She was informed of the importance of frequent follow-up visits to maximize her success with intensive lifestyle modifications for her multiple health conditions.   Check Fasting Labs at next OV.  Objective:   Blood pressure 115/79, pulse 72, temperature 98.5 F (36.9 C), height 5\' 4"  (1.626 m), weight 206 lb (93.4 kg), SpO2 99%. Body mass index is 35.36 kg/m.  General: Cooperative, alert, well developed, in no acute distress. HEENT: Conjunctivae and lids unremarkable. Cardiovascular: Regular rhythm.  Lungs: Normal work of breathing. Neurologic: No focal deficits.   Lab Results  Component Value Date   CREATININE 0.79 04/08/2023   BUN 11 04/08/2023   NA 137 04/08/2023   K 4.6 04/08/2023   CL 101 04/08/2023   CO2 17 (L) 04/08/2023   Lab Results  Component Value Date   ALT 13 04/08/2023   AST 15 04/08/2023   ALKPHOS 73 04/08/2023   BILITOT 0.3 04/08/2023   Lab Results  Component Value Date   HGBA1C 4.8 04/08/2023   HGBA1C 5.0 10/22/2022   HGBA1C 4.9 04/22/2022   HGBA1C 4.9 12/13/2021   HGBA1C 5.1 04/19/2021   Lab Results  Component Value Date   INSULIN 12.3 04/08/2023   INSULIN 5.0 10/22/2022   INSULIN 9.5 04/22/2022   INSULIN 2.3 (L) 12/13/2021   INSULIN 10.2 04/19/2021   Lab Results  Component Value Date   TSH 0.968 12/16/2022   Lab Results  Component Value Date   CHOL 170 12/13/2021   HDL 63 12/13/2021   LDLCALC 94 12/13/2021   TRIG 66 12/13/2021   CHOLHDL 2.7 12/13/2021   Lab Results  Component Value Date   VD25OH 35.7 04/08/2023   VD25OH 47.2  10/22/2022   VD25OH 53.5 04/22/2022   Lab Results  Component Value Date   WBC 8.6 01/29/2021   HGB 14.6 01/29/2021   HCT 43.2 01/29/2021   MCV 93.2 01/29/2021   PLT 329.0 01/29/2021   No results found for: "IRON", "TIBC", "FERRITIN"  Attestation Statements:   Reviewed by clinician on day of visit: allergies, medications, problem list, medical history, surgical history, family history, social history, and previous encounter notes.  I have reviewed the above documentation for accuracy and completeness, and I agree with the above. -  Jakyiah Briones d. Cayetano Mikita, NP-C

## 2023-06-11 ENCOUNTER — Other Ambulatory Visit: Payer: Self-pay

## 2023-06-17 ENCOUNTER — Encounter (INDEPENDENT_AMBULATORY_CARE_PROVIDER_SITE_OTHER): Payer: Self-pay | Admitting: Adult Health

## 2023-06-17 ENCOUNTER — Other Ambulatory Visit (HOSPITAL_COMMUNITY): Payer: Self-pay

## 2023-06-17 ENCOUNTER — Other Ambulatory Visit (INDEPENDENT_AMBULATORY_CARE_PROVIDER_SITE_OTHER): Payer: Self-pay | Admitting: Adult Health

## 2023-06-17 MED ORDER — METFORMIN HCL 500 MG PO TABS
500.0000 mg | ORAL_TABLET | Freq: Two times a day (BID) | ORAL | 0 refills | Status: DC
  Filled 2023-06-17 – 2023-07-24 (×5): qty 180, 90d supply, fill #0

## 2023-06-28 ENCOUNTER — Other Ambulatory Visit: Payer: Self-pay

## 2023-07-10 ENCOUNTER — Other Ambulatory Visit (HOSPITAL_COMMUNITY): Payer: Self-pay

## 2023-07-10 ENCOUNTER — Ambulatory Visit (INDEPENDENT_AMBULATORY_CARE_PROVIDER_SITE_OTHER): Payer: 59 | Admitting: Radiology

## 2023-07-10 ENCOUNTER — Encounter: Payer: Self-pay | Admitting: Radiology

## 2023-07-10 VITALS — BP 110/78 | Ht 63.5 in | Wt 213.0 lb

## 2023-07-10 DIAGNOSIS — Z01419 Encounter for gynecological examination (general) (routine) without abnormal findings: Secondary | ICD-10-CM | POA: Diagnosis not present

## 2023-07-10 DIAGNOSIS — N951 Menopausal and female climacteric states: Secondary | ICD-10-CM | POA: Diagnosis not present

## 2023-07-10 MED ORDER — ESTRADIOL 0.025 MG/24HR TD PTTW
1.0000 | MEDICATED_PATCH | TRANSDERMAL | 0 refills | Status: DC
Start: 2023-07-10 — End: 2023-10-18
  Filled 2023-07-10: qty 24, 84d supply, fill #0

## 2023-07-10 MED ORDER — PROGESTERONE MICRONIZED 100 MG PO CAPS
100.0000 mg | ORAL_CAPSULE | Freq: Every day | ORAL | 0 refills | Status: DC
Start: 2023-07-10 — End: 2023-10-18
  Filled 2023-07-10: qty 90, 90d supply, fill #0

## 2023-07-10 NOTE — Progress Notes (Signed)
Shannon Wagner [birth year] [id number]   History:  47 y.o. G2P2 presents for annual exam. C/o hot flashes, night sweats, low energy, trouble sleeping. 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 Periods are heavy, pain with ovulation.   Gynecologic History Patient's last menstrual period was 06/14/2023 (exact date). Period Cycle (Days): 28 Period Duration (Days): 5 Period Pattern: Regular Menstrual Flow: Heavy Menstrual Control: Maxi pad, Thin pad, Tampon Dysmenorrhea: (!) Moderate Dysmenorrhea Symptoms: Cramping Contraception/Family planning: vasectomy Sexually active: yes Last Pap: 2022. Results were: normal Last mammogram: 09/2022. Results were: normal  Obstetric History OB History  Gravida Para Term Preterm AB Living  2 2 2     2   SAB IAB Ectopic Multiple Live Births          2    # Outcome Date GA Lbr Len/2nd Weight Sex Type Anes PTL Lv  2 Term 02/21/14  04:10 / 00:16 8 lb 3.9 oz (3.74 kg) M Vag-Spont None  LIV     Birth Comments: NONE  1 Term 04/2010            The following portions of the patient's history were reviewed and updated as appropriate: allergies, current medications, past family history, past medical history, past social history, past surgical history, and problem list.  Review of Systems  All other systems reviewed and are negative.   Past medical history, past surgical history, family history and social history were all reviewed and documented in the EPIC chart.  Exam:  Vitals:   07/10/23 1126  BP: 110/78  Weight: 213 lb (96.6 kg)  Height: 5' 3.5" (1.613 m)   Body mass index is 37.14 kg/m.  Physical Exam Vitals and nursing note reviewed. Exam conducted with a chaperone present.  Constitutional:      Appearance: Normal appearance. She is normal weight.  HENT:     Head: Normocephalic and atraumatic.  Neck:     Thyroid: No thyroid mass, thyromegaly or thyroid tenderness.  Cardiovascular:     Rate and Rhythm: Regular  rhythm.     Heart sounds: Normal heart sounds.  Pulmonary:     Effort: Pulmonary effort is normal.     Breath sounds: Normal breath sounds.  Chest:  Breasts:    Breasts are symmetrical.     Right: Normal. No inverted nipple, mass, nipple discharge, skin change or tenderness.     Left: Normal. No inverted nipple, mass, nipple discharge, skin change or tenderness.  Abdominal:     General: Abdomen is flat. Bowel sounds are normal.     Palpations: Abdomen is soft.  Genitourinary:    General: Normal vulva.     Vagina: Normal. No vaginal discharge, bleeding or lesions.     Cervix: Normal. No discharge or lesion.     Uterus: Normal. Not enlarged and not tender.      Adnexa: Right adnexa normal and left adnexa normal.       Right: No mass, tenderness or fullness.         Left: No mass, tenderness or fullness.    Lymphadenopathy:     Upper Body:     Right upper body: No axillary adenopathy.     Left upper body: No axillary adenopathy.  Skin:    General: Skin is warm and dry.  Neurological:     Mental Status: She is alert and oriented to person, place, and time.  Psychiatric:        Mood and  Affect: Mood normal.        Thought Content: Thought content normal.        Judgment: Judgment normal.      Raynelle Fanning, CMA present for exam  Assessment/Plan:   1. Well woman exam with routine gynecological exam Pap 2025 Has a PCP Has Colonoscopy referral from PCP  2. Perimenopausal symptoms Will start HRT to combat symptoms Risks and benefit reviewed Will follow up in 3 months - estradiol (VIVELLE-DOT) 0.025 MG/24HR; Place 1 patch onto the skin 2 (two) times a week.  Dispense: 24 patch; Refill: 0 - progesterone (PROMETRIUM) 100 MG capsule; Take 1 capsule (100 mg total) by mouth daily.  Dispense: 90 capsule; Refill: 0    Discussed SBE, colonoscopy and DEXA screening as directed/appropriate. Recommend of exercise weekly, including weight bearing exercise. Encouraged the use of  seatbelts and sunscreen.  Return in about 3 months (around 10/10/2023) for Med Follow-up.  Arlie Solomons B WHNP-BC 12:06 PM 07/10/2023

## 2023-07-22 ENCOUNTER — Other Ambulatory Visit (HOSPITAL_COMMUNITY): Payer: Self-pay

## 2023-07-22 ENCOUNTER — Other Ambulatory Visit: Payer: Self-pay

## 2023-07-24 ENCOUNTER — Other Ambulatory Visit (HOSPITAL_COMMUNITY): Payer: Self-pay

## 2023-08-09 ENCOUNTER — Other Ambulatory Visit: Payer: Self-pay

## 2023-08-10 ENCOUNTER — Other Ambulatory Visit (HOSPITAL_COMMUNITY): Payer: Self-pay

## 2023-08-15 ENCOUNTER — Telehealth: Payer: 59 | Admitting: Physician Assistant

## 2023-08-15 DIAGNOSIS — L089 Local infection of the skin and subcutaneous tissue, unspecified: Secondary | ICD-10-CM | POA: Diagnosis not present

## 2023-08-15 DIAGNOSIS — L923 Foreign body granuloma of the skin and subcutaneous tissue: Secondary | ICD-10-CM

## 2023-08-15 MED ORDER — CEPHALEXIN 500 MG PO CAPS
500.0000 mg | ORAL_CAPSULE | Freq: Four times a day (QID) | ORAL | 0 refills | Status: DC
Start: 2023-08-15 — End: 2023-09-08

## 2023-08-15 NOTE — Progress Notes (Signed)
E-Visit for Skin Infection  We are sorry that you are not feeling well. Here is how we plan to help!  Based on what you shared with me it looks like you have cellulitis.  Cellulitis looks like areas of skin redness, swelling, and warmth; it develops as a result of bacteria entering under the skin. Little red spots and/or bleeding can be seen in skin, and tiny surface sacs containing fluid can occur. Fever can be present.  I have prescribed:  Keflex 500mg  take one by mouth four times a day for 5 days It would be recommended to stop putting Neosporin on the fresh tattoo. Neosporin can actually cause infection with new tattoos and can also increase the risk of fading.   HOME CARE:  Take your medications as ordered and take all of them, even if the skin irritation appears to be healing.   GET HELP RIGHT AWAY IF:  Symptoms that don't begin to go away within 48 hours. Severe redness persists or worsens If the area turns color, spreads or swells. If it blisters and opens, develops yellow-brown crust or bleeds. You develop a fever or chills. If the pain increases or becomes unbearable.  Are unable to keep fluids and food down.  MAKE SURE YOU   Understand these instructions. Will watch your condition. Will get help right away if you are not doing well or get worse.  Thank you for choosing an e-visit.  Your e-visit answers were reviewed by a board certified advanced clinical practitioner to complete your personal care plan. Depending upon the condition, your plan could have included both over the counter or prescription medications.  Please review your pharmacy choice. Make sure the pharmacy is open so you can pick up prescription now. If there is a problem, you may contact your provider through [employer] and have the prescription routed to another pharmacy.  Your safety is important to [name]. If you have drug allergies check your prescription carefully.   For the next 24 hours you can  use MyChart to ask questions about today's visit, request a non-urgent call back, or ask for a work or school excuse. You will get an email in the next two days asking about your experience. I hope that your e-visit has been valuable and will speed your recovery.   I have spent 5 minutes in review of e-visit questionnaire, review and updating patient chart, medical decision making and response to patient.   Margaretann Loveless, PA-C

## 2023-08-25 DIAGNOSIS — H5203 Hypermetropia, bilateral: Secondary | ICD-10-CM | POA: Diagnosis not present

## 2023-08-25 DIAGNOSIS — H52223 Regular astigmatism, bilateral: Secondary | ICD-10-CM | POA: Diagnosis not present

## 2023-08-25 DIAGNOSIS — H524 Presbyopia: Secondary | ICD-10-CM | POA: Diagnosis not present

## 2023-09-08 ENCOUNTER — Encounter (INDEPENDENT_AMBULATORY_CARE_PROVIDER_SITE_OTHER): Payer: Self-pay | Admitting: Adult Health

## 2023-09-08 ENCOUNTER — Other Ambulatory Visit (HOSPITAL_COMMUNITY): Payer: Self-pay

## 2023-09-08 ENCOUNTER — Other Ambulatory Visit: Payer: Self-pay

## 2023-09-08 ENCOUNTER — Ambulatory Visit (INDEPENDENT_AMBULATORY_CARE_PROVIDER_SITE_OTHER): Payer: 59 | Admitting: Adult Health

## 2023-09-08 VITALS — BP 109/74 | HR 74 | Temp 98.1°F | Ht 64.0 in | Wt 216.0 lb

## 2023-09-08 DIAGNOSIS — E88819 Insulin resistance, unspecified: Secondary | ICD-10-CM | POA: Diagnosis not present

## 2023-09-08 DIAGNOSIS — Z6837 Body mass index (BMI) 37.0-37.9, adult: Secondary | ICD-10-CM

## 2023-09-08 DIAGNOSIS — Z6841 Body Mass Index (BMI) 40.0 and over, adult: Secondary | ICD-10-CM

## 2023-09-08 DIAGNOSIS — E038 Other specified hypothyroidism: Secondary | ICD-10-CM

## 2023-09-08 DIAGNOSIS — E559 Vitamin D deficiency, unspecified: Secondary | ICD-10-CM | POA: Diagnosis not present

## 2023-09-08 DIAGNOSIS — F411 Generalized anxiety disorder: Secondary | ICD-10-CM

## 2023-09-08 DIAGNOSIS — E669 Obesity, unspecified: Secondary | ICD-10-CM | POA: Diagnosis not present

## 2023-09-08 DIAGNOSIS — Z0000 Encounter for general adult medical examination without abnormal findings: Secondary | ICD-10-CM

## 2023-09-08 MED ORDER — LEVOTHYROXINE SODIUM 150 MCG PO TABS
150.0000 ug | ORAL_TABLET | Freq: Every day | ORAL | 0 refills | Status: DC
  Filled 2023-09-08 – 2023-10-30 (×3): qty 90, 90d supply, fill #0

## 2023-09-08 MED ORDER — FLUOXETINE HCL 40 MG PO CAPS
40.0000 mg | ORAL_CAPSULE | Freq: Every day | ORAL | 0 refills | Status: DC
  Filled 2023-09-08 – 2023-11-11 (×3): qty 90, 90d supply, fill #0

## 2023-09-08 MED ORDER — BUPROPION HCL ER (XL) 150 MG PO TB24
150.0000 mg | ORAL_TABLET | Freq: Every day | ORAL | 0 refills | Status: DC
  Filled 2023-09-08: qty 90, 90d supply, fill #0

## 2023-09-08 NOTE — Progress Notes (Signed)
WEIGHT SUMMARY AND BIOMETRICS  Vitals Temp: 98.1 F (36.7 C) BP: 109/74 Pulse Rate: 74 SpO2: 98 %   Anthropometric Measurements Height: 5\' 4"  (1.626 m) Weight: 216 lb (98 kg) BMI (Calculated): 37.06 Weight at Last Visit: 206 lb Weight Lost Since Last Visit: 0 Weight Gained Since Last Visit: 10 lb Starting Weight: 236 lb Total Weight Loss (lbs): 20 lb (9.072 kg)   Body Composition  Body Fat %: 44.1 % Fat Mass (lbs): 95.4 lbs Muscle Mass (lbs): 114.8 lbs Total Body Water (lbs): 82.6 lbs Visceral Fat Rating : 11   Other Clinical Data Fasting: yes Labs: no Today's Visit #: 24 Starting Date: 04/19/21    Chief Complaint:   OBESITY Shannon Wagner is here to discuss her progress with her obesity treatment plan. She is on the practicing portion control and making smarter food choices, such as increasing vegetables and decreasing simple carbohydrates and states she is following her eating plan approximately 50 % of the time. She states she is exercising Walking and Pilates 60 minutes 1/1 times per week.   Interim History:  She is being followed by [facility]  Last dose of max Wegovy 2.4mg  > 3-4 months ago  She has tolerated both Wegovy and Mounjaro therapy well- would very much like to restart weekly injectable therapy  Of Note- Metformin 500mg  daily was increased to BID on/about 06/17/2023 She denies GI upset  She also feels that Metformin is NOT helping with food noise or cravings  Since stopping Wegovy 3-4 months go, she reports increase in "food noise" and she is "able to overeat" She would like to stop the ineffective metformin therapy  Subjective:   1. Other specified hypothyroidism Upon records check you were previously on Levothyrozine , increased to 175 mcg after TSH of 32.50 on 09/28/2019   Hypothyroidism Dx'd on/about 2008   Levothyroxine decreased from, to on 10/23/2022  She reports intermittent fatigue  2. Healthcare  maintenance GYN Provider has started her on bi-weekly Estradiol Patch She denies tobacco/vape use She denies first degree family hx of early heart disease  3. Vitamin D deficiency She reports intermittent fatigue She is on weekly Ergocalciferol- denies N/V/Muscle Weakness  4. Insulin resistance Metformin 500mg  daily was increased to BID on/about 06/17/2023 She denies GI upset  She also feels that Metformin is NOT helping with food noise or cravings  Since stopping Wegovy 3-4 months go, she reports increase in "food noise" and she is "able to overeat" She would like to stop the ineffective metformin therapy  5. Generalized anxiety disorder She endorses stable mood, denies SI/HI She is on daily Wellbutrin XL 150mg  and Prozac 40mg   Assessment/Plan:   1. Other specified hypothyroidism Check Labs and Refill - TSH + free T4 - levothyroxine (SYNTHROID) 150 MCG tablet; Take 1 tablet (150 mcg total) by mouth daily before breakfast.  Dispense: 90 tablet; Refill: 0  2. Healthcare maintenance Check Labs - Comprehensive metabolic panel - Vitamin B12  3. Vitamin D deficiency (Primary) Check Labs - VITAMIN D 25 Hydroxy (Vit-D Deficiency, Fractures)  4. Insulin resistance Check Labs - Insulin, random - Hemoglobin A1c  STOP METFORMIN  5. Generalized anxiety disorder Refill - FLUoxetine (PROZAC) 40 MG capsule; Take 1 capsule (40 mg total) by mouth daily.  Dispense: 90 capsule; Refill: 0  5. Obesity with current BMI of 37.6  Shannon Wagner is not currently in the action stage of change. As such, her goal is to get back to weightloss efforts .  She has agreed to practicing portion control and making smarter food choices, such as increasing vegetables and decreasing simple carbohydrates.   Exercise goals: For substantial health benefits, adults should do at least 150 minutes (2 hours and 30 minutes) a week of moderate-intensity, or 75 minutes (1 hour and 15 minutes) a week of  vigorous-intensity aerobic physical activity, or an equivalent combination of moderate- and vigorous-intensity aerobic activity. Aerobic activity should be performed in episodes of at least 10 minutes, and preferably, it should be spread throughout the week.  Behavioral modification strategies: increasing lean protein intake, decreasing simple carbohydrates, increasing vegetables, increasing water intake, no skipping meals, meal planning and cooking strategies, keeping healthy foods in the home, ways to avoid boredom eating, holiday eating strategies , avoiding temptations, and planning for success.  Shannon Wagner has agreed to follow-up with our clinic in 12 weeks. She was informed of the importance of frequent follow-up visits to maximize her success with intensive lifestyle modifications for her multiple health conditions.   Shannon Wagner was informed we would discuss her lab results at her next visit unless there is a critical issue that needs to be addressed sooner. Shannon Wagner agreed to keep her next visit at the agreed upon time to discuss these results.  Objective:   Blood pressure 109/74, pulse 74, temperature 98.1 F (36.7 C), height 5\' 4"  (1.626 m), weight 216 lb (98 kg), last menstrual period 08/30/2023, SpO2 98%. Body mass index is 37.08 kg/m.  General: Cooperative, alert, well developed, in no acute distress. HEENT: Conjunctivae and lids unremarkable. Cardiovascular: Regular rhythm.  Lungs: Normal work of breathing. Neurologic: No focal deficits.   Lab Results  Component Value Date   CREATININE 0.79 04/08/2023   BUN 11 04/08/2023   NA 137 04/08/2023   K 4.6 04/08/2023   CL 101 04/08/2023   CO2 17 (L) 04/08/2023   Lab Results  Component Value Date   ALT 13 04/08/2023   AST 15 04/08/2023   ALKPHOS 73 04/08/2023   BILITOT 0.3 04/08/2023   Lab Results  Component Value Date   HGBA1C 4.8 04/08/2023   HGBA1C 5.0 10/22/2022   HGBA1C 4.9 04/22/2022   HGBA1C 4.9 12/13/2021   HGBA1C  5.1 04/19/2021   Lab Results  Component Value Date   INSULIN 12.3 04/08/2023   INSULIN 5.0 10/22/2022   INSULIN 9.5 04/22/2022   INSULIN 2.3 (L) 12/13/2021   INSULIN 10.2 04/19/2021   Lab Results  Component Value Date   TSH 0.968 12/16/2022   Lab Results  Component Value Date   CHOL 170 12/13/2021   HDL 63 12/13/2021   LDLCALC 94 12/13/2021   TRIG 66 12/13/2021   CHOLHDL 2.7 12/13/2021   Lab Results  Component Value Date   VD25OH 35.7 04/08/2023   VD25OH 47.2 10/22/2022   VD25OH 53.5 04/22/2022   Lab Results  Component Value Date   WBC 8.6 01/29/2021   HGB 14.6 01/29/2021   HCT 43.2 01/29/2021   MCV 93.2 01/29/2021   PLT 329.0 01/29/2021   No results found for: "IRON", "TIBC", "FERRITIN"  Attestation Statements:   Reviewed by clinician on day of visit: allergies, medications, problem list, medical history, surgical history, family history, social history, and previous encounter notes.  I have reviewed the above documentation for accuracy and completeness, and I agree with the above. -  Sunshine Mackowski d. Krystian Ferrentino, NP-C

## 2023-09-09 ENCOUNTER — Other Ambulatory Visit (HOSPITAL_COMMUNITY): Payer: Self-pay

## 2023-09-09 LAB — COMPREHENSIVE METABOLIC PANEL
ALT: 16 [IU]/L (ref 0–32)
AST: 16 [IU]/L (ref 0–40)
Albumin: 4.5 g/dL (ref 3.9–4.9)
Alkaline Phosphatase: 76 [IU]/L (ref 44–121)
BUN/Creatinine Ratio: 22 (ref 9–23)
BUN: 17 mg/dL (ref 6–24)
Bilirubin Total: 0.5 mg/dL (ref 0.0–1.2)
CO2: 19 mmol/L — ABNORMAL LOW (ref 20–29)
Calcium: 9.4 mg/dL (ref 8.7–10.2)
Chloride: 100 mmol/L (ref 96–106)
Creatinine, Ser: 0.76 mg/dL (ref 0.57–1.00)
Globulin, Total: 2.3 g/dL (ref 1.5–4.5)
Glucose: 81 mg/dL (ref 70–99)
Potassium: 4.8 mmol/L (ref 3.5–5.2)
Sodium: 138 mmol/L (ref 134–144)
Total Protein: 6.8 g/dL (ref 6.0–8.5)
eGFR: 98 mL/min/{1.73_m2} (ref >59)

## 2023-09-09 LAB — VITAMIN B12: Vitamin B-12: 519 pg/mL (ref 232–1245)

## 2023-09-09 LAB — INSULIN, RANDOM: INSULIN: 6.4 u[IU]/mL (ref 2.6–24.9)

## 2023-09-09 LAB — VITAMIN D 25 HYDROXY (VIT D DEFICIENCY, FRACTURES): Vit D, 25-Hydroxy: 50.5 ng/mL (ref 30.0–100.0)

## 2023-09-09 LAB — TSH+FREE T4
Free T4: 1.6 ng/dL (ref 0.82–1.77)
TSH: 4.02 u[IU]/mL (ref 0.450–4.500)

## 2023-09-09 LAB — HEMOGLOBIN A1C
Est. average glucose Bld gHb Est-mCnc: 100 mg/dL
Hgb A1c MFr Bld: 5.1 % (ref 4.8–5.6)

## 2023-10-16 ENCOUNTER — Ambulatory Visit: Payer: 59 | Admitting: Radiology

## 2023-10-18 ENCOUNTER — Other Ambulatory Visit: Payer: Self-pay | Admitting: Radiology

## 2023-10-18 DIAGNOSIS — N951 Menopausal and female climacteric states: Secondary | ICD-10-CM

## 2023-10-19 ENCOUNTER — Other Ambulatory Visit: Payer: Self-pay

## 2023-10-20 ENCOUNTER — Other Ambulatory Visit: Payer: Self-pay

## 2023-10-20 NOTE — Telephone Encounter (Signed)
 Medication refill request: etradiol  Last AEX:  07/10/23 Next AEX: 10/30/23 Last MMG (if hormonal medication request): 09/24/22 Bi-rads 1 neg  Refill authorized: please advise    Medication refill request: progesterone   Last AEX:  07/10/23 Next AEX: 10/30/23 Last MMG (if hormonal medication request): 09/24/22 Bi-rads 1 neg  Refill authorized: please advise

## 2023-10-21 ENCOUNTER — Other Ambulatory Visit: Payer: Self-pay

## 2023-10-21 ENCOUNTER — Other Ambulatory Visit (HOSPITAL_COMMUNITY): Payer: Self-pay

## 2023-10-21 MED ORDER — ESTRADIOL 0.025 MG/24HR TD PTTW
1.0000 | MEDICATED_PATCH | TRANSDERMAL | 0 refills | Status: DC
  Filled 2023-10-21: qty 8, 28d supply, fill #0

## 2023-10-21 MED ORDER — PROGESTERONE MICRONIZED 100 MG PO CAPS
100.0000 mg | ORAL_CAPSULE | Freq: Every day | ORAL | 0 refills | Status: DC
  Filled 2023-10-21: qty 30, 30d supply, fill #0

## 2023-10-29 ENCOUNTER — Other Ambulatory Visit (HOSPITAL_COMMUNITY): Payer: Self-pay

## 2023-10-30 ENCOUNTER — Ambulatory Visit: Payer: 59 | Admitting: Radiology

## 2023-10-30 ENCOUNTER — Other Ambulatory Visit (HOSPITAL_COMMUNITY): Payer: Self-pay

## 2023-11-04 ENCOUNTER — Other Ambulatory Visit (HOSPITAL_COMMUNITY): Payer: Self-pay

## 2023-11-04 ENCOUNTER — Ambulatory Visit (INDEPENDENT_AMBULATORY_CARE_PROVIDER_SITE_OTHER): Payer: Commercial Managed Care - PPO | Admitting: Radiology

## 2023-11-04 ENCOUNTER — Other Ambulatory Visit: Payer: Self-pay

## 2023-11-04 DIAGNOSIS — N951 Menopausal and female climacteric states: Secondary | ICD-10-CM | POA: Diagnosis not present

## 2023-11-04 MED ORDER — PROGESTERONE MICRONIZED 100 MG PO CAPS
100.0000 mg | ORAL_CAPSULE | Freq: Every evening | ORAL | 0 refills | Status: DC
  Filled 2023-11-04 – 2024-01-24 (×3): qty 90, 90d supply, fill #0

## 2023-11-04 MED ORDER — ESTRADIOL 0.05 MG/24HR TD PTTW
1.0000 | MEDICATED_PATCH | TRANSDERMAL | 0 refills | Status: DC
  Filled 2023-11-04: qty 24, 84d supply, fill #0

## 2023-11-04 NOTE — Progress Notes (Signed)
   Shannon Wagner 1976-06-24 [id number]   History:  48 y.o. G2P2 here for follow up after starting HRT. Had some improvement in mood and heat tolerance, would like to increase dosing. Still struggling with fatigue and afternoon slump. Drinks a celcius at lunch with no improvement. On thyroid meds, recent labs were in range.  Gynecologic History Patient's last menstrual period was 10/25/2023 (exact date).   Contraception/Family planning: vasectomy Sexually active: yes   Obstetric History OB History  Gravida Para Term Preterm AB Living  2 2 2   2   SAB IAB Ectopic Multiple Live Births      2    # Outcome Date GA Lbr Len/2nd Weight Sex Type Anes PTL Lv  2 Term 02/21/14  04:10 / 00:16 8 lb 3.9 oz (3.74 kg) M Vag-Spont None  LIV     Birth Comments: NONE  1 Term 04/2010            The following portions of the patient's history were reviewed and updated as appropriate: allergies, current medications, past family history, past medical history, past social history, past surgical history, and problem list.  ROS  Past medical history, past surgical history, family history and social history were all reviewed and documented in the EPIC chart.  Exam:  Vitals:   11/04/23 0930  BP: 114/78  Weight: 228 lb 12.8 oz (103.8 kg)   Body mass index is 39.27 kg/m.  Physical Exam Vitals and nursing note reviewed.  Constitutional:      Appearance: Normal appearance. She is obese.  Pulmonary:     Effort: Pulmonary effort is normal.  Neurological:     Mental Status: She is alert.  Psychiatric:        Mood and Affect: Mood normal.        Thought Content: Thought content normal.        Judgment: Judgment normal.      Raynelle Fanning, CMA present for exam  Assessment/Plan:   1. Perimenopausal symptoms Will increase dosing of estrogen x 3 months, if she is feeling better will continue, if not will increase again. If any bleeding noted will contact me and we will increase progesterone to 200mg .  Pt agreeable to plan. - progesterone (PROMETRIUM) 100 MG capsule; Take 1 capsule (100 mg total) by mouth at bedtime.  Dispense: 90 capsule; Refill: 0 - estradiol (VIVELLE-DOT) 0.05 MG/24HR patch; Place 1 patch (0.05 mg total) onto the skin 2 (two) times a week.  Dispense: 24 patch; Refill: 0   Add a B complex late morning for energy. Consider going back on a GLP-1 to help with weight loss (insurance stopped covering)  Tanda Rockers WHNP-BC 9:56 AM 11/04/2023

## 2023-11-11 ENCOUNTER — Encounter (INDEPENDENT_AMBULATORY_CARE_PROVIDER_SITE_OTHER): Payer: Self-pay | Admitting: Adult Health

## 2023-11-11 ENCOUNTER — Other Ambulatory Visit (HOSPITAL_COMMUNITY): Payer: Self-pay

## 2023-11-11 ENCOUNTER — Other Ambulatory Visit: Payer: Self-pay

## 2023-11-21 ENCOUNTER — Other Ambulatory Visit: Payer: Self-pay | Admitting: Physician Assistant

## 2023-11-21 DIAGNOSIS — Z0000 Encounter for general adult medical examination without abnormal findings: Secondary | ICD-10-CM

## 2023-11-25 ENCOUNTER — Encounter: Payer: Self-pay | Admitting: Physician Assistant

## 2023-11-26 ENCOUNTER — Other Ambulatory Visit (HOSPITAL_COMMUNITY): Payer: Self-pay

## 2023-11-26 ENCOUNTER — Ambulatory Visit (INDEPENDENT_AMBULATORY_CARE_PROVIDER_SITE_OTHER): Admitting: Physician Assistant

## 2023-11-26 ENCOUNTER — Encounter: Payer: Self-pay | Admitting: Physician Assistant

## 2023-11-26 VITALS — BP 110/76 | HR 62 | Temp 98.2°F | Ht 64.0 in | Wt 227.0 lb

## 2023-11-26 DIAGNOSIS — E038 Other specified hypothyroidism: Secondary | ICD-10-CM

## 2023-11-26 DIAGNOSIS — F411 Generalized anxiety disorder: Secondary | ICD-10-CM

## 2023-11-26 DIAGNOSIS — E669 Obesity, unspecified: Secondary | ICD-10-CM | POA: Diagnosis not present

## 2023-11-26 MED ORDER — LEVOTHYROXINE SODIUM 150 MCG PO TABS
150.0000 ug | ORAL_TABLET | Freq: Every day | ORAL | 1 refills | Status: DC
  Filled 2023-11-26 – 2024-01-24 (×2): qty 90, 90d supply, fill #0
  Filled 2024-04-25: qty 90, 90d supply, fill #1
  Filled 2024-04-28: qty 90, 90d supply, fill #0

## 2023-11-26 MED ORDER — FLUOXETINE HCL 40 MG PO CAPS
40.0000 mg | ORAL_CAPSULE | Freq: Every day | ORAL | 3 refills | Status: AC
  Filled 2023-11-26 – 2024-02-07 (×3): qty 90, 90d supply, fill #0
  Filled 2024-05-04: qty 90, 90d supply, fill #1
  Filled 2024-08-10: qty 90, 90d supply, fill #2

## 2023-11-26 MED ORDER — BUPROPION HCL ER (XL) 150 MG PO TB24
150.0000 mg | ORAL_TABLET | Freq: Every day | ORAL | 1 refills | Status: AC
  Filled 2023-11-26: qty 90, 90d supply, fill #0

## 2023-11-26 NOTE — Progress Notes (Signed)
 Shannon Wagner is a 48 y.o. female here for a follow up of a pre-existing problem.  History of Present Illness:   Chief Complaint  Patient presents with   Medication Refill    Pt would like you to take over her medications for thyroid and anxiety.    HPI  Anxiety  Reports compliance and good tolerance of Wellbutrin XL 150 mg and Prozac 40 mg daily. Requesting refills.  Wellbutrin was initiated to aid in fatigue however, she states that she has recently put on an estrogen patch and her fatigue levels have improved.  She states that she has not been taking Xanax for quite sometime.  No further concerns or symptoms at this time.  Hypothyroidism  Reports compliance and good tolerance of Synthroid 150 mcg daily. Most recent TSH levels were normal  No concerns or symptoms at this time.  Weight Management  She's currently on a compounded semaglutide. She has been taking it for the past 3 weeks. Tolerates this well.   Denies any sleep apnea symptoms.   Past Medical History:  Diagnosis Date   Acquired hypothyroidism 04/10/2017   Anxiety    Back pain    Depression    Joint pain    Obesity (BMI 30-39.9) 04/10/2017   Other fatigue    SOB (shortness of breath) on exertion    Vitamin D deficiency      Social History   Tobacco Use   Smoking status: Never    Passive exposure: Never   Smokeless tobacco: Never  Vaping Use   Vaping status: Never Used  Substance Use Topics   Alcohol use: Yes    Alcohol/week: 3.0 standard drinks of alcohol    Types: 3 Standard drinks or equivalent per week    Comment: social    Drug use: No    No past surgical history on file.  Family History  Problem Relation Age of Onset   Cancer - Other Mother        Bile Duct   Liver disease Mother    Obesity Mother    Heart failure Father    Hypertension Father    Diabetes Father    Heart disease Father    Breast cancer Maternal Grandmother    Heart disease Paternal Grandmother    Heart disease  Paternal Grandfather    Breast cancer Maternal Aunt     No Known Allergies  Current Medications:   Current Outpatient Medications:    Calcium-Phosphorus-Vitamin D (CALCIUM GUMMIES PO), Take by mouth., Disp: , Rfl:    diphenhydrAMINE (BENADRYL) 25 MG tablet, Take 25 mg by mouth every 6 (six) hours as needed for sleep., Disp: , Rfl:    estradiol (VIVELLE-DOT) 0.05 MG/24HR patch, Place 1 patch (0.05 mg total) onto the skin 2 (two) times a week., Disp: 24 patch, Rfl: 0   famotidine (PEPCID) 20 MG tablet, Take 20 mg by mouth 2 (two) times daily., Disp: , Rfl:    ibuprofen (ADVIL,MOTRIN) 600 MG tablet, Take 1 tablet (600 mg total) by mouth every 6 (six) hours., Disp: 30 tablet, Rfl: 0   melatonin 3 MG TABS tablet, Take 3 mg by mouth at bedtime., Disp: , Rfl:    Multiple Vitamin (MULTI VITAMIN PO), Take by mouth., Disp: , Rfl:    Multiple Vitamins-Minerals (HAIR SKIN AND NAILS FORMULA PO), Take by mouth., Disp: , Rfl:    Probiotic Product (RA PROBIOTIC GUMMIES PO), Take by mouth., Disp: , Rfl:    progesterone (PROMETRIUM) 100 MG capsule,  Take 1 capsule (100 mg total) by mouth at bedtime., Disp: 90 capsule, Rfl: 0   SEMAGLUTIDE-WEIGHT MANAGEMENT [place], Inject 0.25 mg into the skin once a week., Disp: , Rfl:    buPROPion (WELLBUTRIN XL) 150 MG 24 hr tablet, Take 1 tablet (150 mg total) by mouth daily., Disp: 90 tablet, Rfl: 1   FLUoxetine (PROZAC) 40 MG capsule, Take 1 capsule (40 mg total) by mouth daily., Disp: 90 capsule, Rfl: 3   levothyroxine (SYNTHROID) 150 MCG tablet, Take 1 tablet (150 mcg total) by mouth daily before breakfast., Disp: 90 tablet, Rfl: 1   Review of Systems:   ROS Negative unless otherwise specified per HPI.  Vitals:   Vitals:   11/26/23 0920  BP: 110/76  Pulse: 62  Temp: 98.2 F (36.8 C)  TempSrc: Temporal  SpO2: 97%  Weight: 227 lb (103 kg)  Height: 5\' 4"  (1.626 m)     Body mass index is 38.96 kg/m.  Physical Exam:   Physical Exam Vitals and nursing note  reviewed.  Constitutional:      General: She is not in acute distress.    Appearance: She is well-developed. She is not ill-appearing or toxic-appearing.  Cardiovascular:     Rate and Rhythm: Normal rate and regular rhythm.     Pulses: Normal pulses.     Heart sounds: Normal heart sounds, S1 normal and S2 normal.  Pulmonary:     Effort: Pulmonary effort is normal.     Breath sounds: Normal breath sounds.  Skin:    General: Skin is warm and dry.  Neurological:     Mental Status: She is alert.     GCS: GCS eye subscore is 4. GCS verbal subscore is 5. GCS motor subscore is 6.  Psychiatric:        Speech: Speech normal.        Behavior: Behavior normal. Behavior is cooperative.     Assessment and Plan:   Other specified hypothyroidism Well controlled Refill levothyroxine 150 mcg daily Follow up in 6 month(s), sooner if concerns  Generalized anxiety disorder Well controlled Continue Wellbutrin XL 150 mg and Prozac 40 mg daily She is interested in stopping Wellbutrin -- we discussed just stopping completely while continuing to take Prozac  If any issues with stopping medication, reach out and we will advise further on next steps Follow up in 6 month(s), sooner if concerns  Obesity Ongoing Doing well with compounded semaglutide Discussed Zepbound [employer] vials -- may consider this, she will reach out Follow up in 3-6 month(s), sooner if concerns   Jarold Motto, PA-C  I,Safa M Kadhim,acting as a scribe for [employer], PA.,have documented all relevant documentation on the behalf of Jarold Motto, PA,as directed by  Jarold Motto, PA while in the presence of Jarold Motto, [place].   I, Jarold Motto, [place], have reviewed all documentation for this visit. The documentation on 11/26/23 for the exam, diagnosis, procedures, and orders are all accurate and complete.

## 2023-11-27 ENCOUNTER — Encounter

## 2023-12-01 ENCOUNTER — Other Ambulatory Visit (HOSPITAL_COMMUNITY): Payer: Self-pay

## 2023-12-01 ENCOUNTER — Ambulatory Visit
Admission: RE | Admit: 2023-12-01 | Discharge: 2023-12-01 | Disposition: A | Discharge status: Home / Self Care | Source: Ambulatory Visit | Attending: Physician Assistant | Admitting: Physician Assistant

## 2023-12-01 DIAGNOSIS — Z0000 Encounter for general adult medical examination without abnormal findings: Secondary | ICD-10-CM

## 2023-12-01 DIAGNOSIS — Z1231 Encounter for screening mammogram for malignant neoplasm of breast: Secondary | ICD-10-CM | POA: Diagnosis not present

## 2023-12-01 MED ORDER — BISACODYL EC 5 MG PO TBEC
DELAYED_RELEASE_TABLET | ORAL | 0 refills | Status: AC
  Filled 2023-12-01: qty 4, 1d supply, fill #0

## 2023-12-01 MED ORDER — PEG 3350-KCL-NA BICARB-NACL 420 G PO SOLR
ORAL | 0 refills | Status: AC
  Filled 2023-12-01: qty 4000, 1d supply, fill #0

## 2023-12-02 ENCOUNTER — Ambulatory Visit (INDEPENDENT_AMBULATORY_CARE_PROVIDER_SITE_OTHER): Payer: 59 | Admitting: Adult Health

## 2023-12-23 DIAGNOSIS — Z1211 Encounter for screening for malignant neoplasm of colon: Secondary | ICD-10-CM | POA: Diagnosis not present

## 2023-12-23 DIAGNOSIS — K635 Polyp of colon: Secondary | ICD-10-CM | POA: Diagnosis not present

## 2023-12-23 LAB — HM COLONOSCOPY

## 2024-01-15 ENCOUNTER — Encounter (HOSPITAL_COMMUNITY): Payer: Self-pay

## 2024-01-22 ENCOUNTER — Encounter: Admitting: Physician Assistant

## 2024-01-24 ENCOUNTER — Other Ambulatory Visit: Payer: Self-pay

## 2024-01-24 ENCOUNTER — Other Ambulatory Visit (HOSPITAL_COMMUNITY): Payer: Self-pay

## 2024-01-24 ENCOUNTER — Other Ambulatory Visit: Payer: Self-pay | Admitting: Radiology

## 2024-01-24 DIAGNOSIS — N951 Menopausal and female climacteric states: Secondary | ICD-10-CM

## 2024-01-26 MED ORDER — ESTRADIOL 0.05 MG/24HR TD PTTW
1.0000 | MEDICATED_PATCH | TRANSDERMAL | 0 refills | Status: DC
  Filled 2024-01-26 – 2024-01-27 (×2): qty 24, 84d supply, fill #0

## 2024-01-26 NOTE — Telephone Encounter (Signed)
 Med refill request: Estradiol  Patches Last AEX: 07/10/2023-JC Next AEX: nothing currently, recall placed for 2025 Last MMG (if hormonal med): 12/01/2023-WNL Refill authorized: rx pend.   Per last OV note, Hrt was increased on 11/04/2023. If pt is feeling better, can continue, if not, needs to be seen.   Will send mychart msg to pt to inquire.

## 2024-01-27 ENCOUNTER — Other Ambulatory Visit (HOSPITAL_COMMUNITY): Payer: Self-pay

## 2024-01-27 ENCOUNTER — Other Ambulatory Visit: Payer: Self-pay

## 2024-02-07 ENCOUNTER — Other Ambulatory Visit (HOSPITAL_COMMUNITY): Payer: Self-pay

## 2024-02-07 ENCOUNTER — Other Ambulatory Visit (HOSPITAL_BASED_OUTPATIENT_CLINIC_OR_DEPARTMENT_OTHER): Payer: Self-pay

## 2024-04-25 ENCOUNTER — Other Ambulatory Visit: Payer: Self-pay | Admitting: Radiology

## 2024-04-25 ENCOUNTER — Other Ambulatory Visit: Payer: Self-pay | Admitting: Obstetrics and Gynecology

## 2024-04-25 DIAGNOSIS — N951 Menopausal and female climacteric states: Secondary | ICD-10-CM

## 2024-04-26 NOTE — Telephone Encounter (Signed)
 Medication refill request: progesterone    Last AEX:  07/10/23 Next AEX: not scheduled  Last MMG (if hormonal medication request): 12/02/23 bi-rads 1 normal  Refill authorized: please advise

## 2024-04-26 NOTE — Telephone Encounter (Signed)
 Medication refill request: estradiol  patch  Last AEX:  07/10/23 Next AEX: not scheduled  Last MMG (if hormonal medication request): 12/02/23 bi-rads 1 normal  Refill authorized: please advise

## 2024-04-27 ENCOUNTER — Other Ambulatory Visit: Payer: Self-pay

## 2024-04-27 ENCOUNTER — Other Ambulatory Visit (HOSPITAL_COMMUNITY): Payer: Self-pay

## 2024-04-27 ENCOUNTER — Encounter: Payer: Self-pay | Admitting: Pharmacist

## 2024-04-27 MED ORDER — ESTRADIOL 0.05 MG/24HR TD PTTW
1.0000 | MEDICATED_PATCH | TRANSDERMAL | 0 refills | Status: DC
  Filled 2024-04-27 – 2024-04-28 (×2): qty 24, 84d supply, fill #0

## 2024-04-27 MED ORDER — PROGESTERONE MICRONIZED 100 MG PO CAPS
100.0000 mg | ORAL_CAPSULE | Freq: Every evening | ORAL | 0 refills | Status: DC
  Filled 2024-04-27 – 2024-04-28 (×2): qty 90, 90d supply, fill #0

## 2024-04-27 NOTE — Telephone Encounter (Signed)
 Spoke to patient. She confirms that she is taking the progesterone  with the estrogen. Looks like Jami refilled the progesterone  today.

## 2024-04-28 ENCOUNTER — Other Ambulatory Visit (HOSPITAL_COMMUNITY): Payer: Self-pay

## 2024-04-28 ENCOUNTER — Other Ambulatory Visit: Payer: Self-pay

## 2024-04-28 ENCOUNTER — Encounter: Payer: Self-pay | Admitting: Pharmacist

## 2024-04-29 ENCOUNTER — Other Ambulatory Visit: Payer: Self-pay

## 2024-07-22 ENCOUNTER — Ambulatory Visit: Admitting: Radiology

## 2024-07-27 ENCOUNTER — Other Ambulatory Visit: Payer: Self-pay | Admitting: Physician Assistant

## 2024-07-27 ENCOUNTER — Other Ambulatory Visit: Payer: Self-pay

## 2024-07-27 ENCOUNTER — Other Ambulatory Visit: Payer: Self-pay | Admitting: Radiology

## 2024-07-27 ENCOUNTER — Other Ambulatory Visit (HOSPITAL_COMMUNITY): Payer: Self-pay

## 2024-07-27 ENCOUNTER — Other Ambulatory Visit: Payer: Self-pay | Admitting: Obstetrics and Gynecology

## 2024-07-27 DIAGNOSIS — N951 Menopausal and female climacteric states: Secondary | ICD-10-CM

## 2024-07-27 DIAGNOSIS — E038 Other specified hypothyroidism: Secondary | ICD-10-CM

## 2024-07-27 MED ORDER — PROGESTERONE MICRONIZED 100 MG PO CAPS
100.0000 mg | ORAL_CAPSULE | Freq: Every evening | ORAL | 0 refills | Status: AC
  Filled 2024-07-27 – 2024-08-02 (×2): qty 90, 90d supply, fill #0

## 2024-07-27 MED ORDER — LEVOTHYROXINE SODIUM 150 MCG PO TABS
150.0000 ug | ORAL_TABLET | Freq: Every day | ORAL | 0 refills | Status: AC
  Filled 2024-07-27 – 2024-08-02 (×2): qty 90, 90d supply, fill #0

## 2024-07-27 MED ORDER — ESTRADIOL 0.05 MG/24HR TD PTTW
1.0000 | MEDICATED_PATCH | TRANSDERMAL | 0 refills | Status: AC
  Filled 2024-07-27 – 2024-08-02 (×2): qty 24, 84d supply, fill #0

## 2024-07-27 NOTE — Telephone Encounter (Signed)
 Med refill request: Progesterone  (prometrium ) 100 mg Last AEX: 07/10/23 JC Next AEX: 08/31/24 JC Last MMG (if hormonal med) 12/01/23 Refill authorized: Last Rx sent #90 with zero refills on 04/27/24 JC. Please Advise?

## 2024-07-27 NOTE — Telephone Encounter (Signed)
 Med refill request: estradiol  (vivelle -dot) patch Last AEX: 07/10/23 JC Next AEX: 08/31/24 JC Last MMG (if hormonal med) 12/01/23 Refill authorized: Last Rx sent #24 with zero refills on 04/29/24 BS. Please Advise?

## 2024-07-28 ENCOUNTER — Encounter: Payer: Self-pay | Admitting: Pharmacist

## 2024-07-28 ENCOUNTER — Other Ambulatory Visit: Payer: Self-pay

## 2024-08-02 ENCOUNTER — Other Ambulatory Visit (HOSPITAL_COMMUNITY): Payer: Self-pay

## 2024-08-02 ENCOUNTER — Other Ambulatory Visit: Payer: Self-pay

## 2024-08-31 ENCOUNTER — Ambulatory Visit: Admitting: Radiology
